# Patient Record
Sex: Male | Born: 1970 | Race: White | Hispanic: No | Smoking: Current every day smoker
Health system: Southern US, Community
[De-identification: ages and names within clinical notes are randomized; demographics above are authoritative.]

---

## 2002-07-09 ENCOUNTER — Inpatient Hospital Stay (HOSPITAL_COMMUNITY): Admission: EM | Admit: 2002-07-09 | Discharge: 2002-07-13 | Payer: Self-pay | Admitting: Psychiatry

## 2007-10-26 ENCOUNTER — Emergency Department: Payer: Self-pay | Admitting: Emergency Medicine

## 2008-07-03 ENCOUNTER — Emergency Department: Payer: Self-pay | Admitting: Emergency Medicine

## 2008-10-20 ENCOUNTER — Inpatient Hospital Stay: Payer: Self-pay | Admitting: Psychiatry

## 2009-11-27 ENCOUNTER — Inpatient Hospital Stay: Payer: Self-pay | Admitting: Surgery

## 2010-02-08 ENCOUNTER — Inpatient Hospital Stay: Payer: Self-pay | Admitting: Surgery

## 2010-11-24 IMAGING — CT CT ABD-PELV W/ CM
1 of 2 series · 15 of 32 positions shown, 19 images · non-contrast
Comparison: none

REASON FOR EXAM: (1) abd pain, wbc 17k; (2) abd pain, wbc 17k
COMMENTS:

PROCEDURE:     CT  - CT ABDOMEN / PELVIS  W  - February 08, 2010  [DATE]
RESULT:
HISTORY: Abdominal pain and elevated white blood cell count. The patient
has had a prior appendectomy.
COMPARISON STUDY:    Prior study of 11/26/2009.

[Series 2: 3mm soft tissue · axial · 0.80mm/px · z∈[-310,+173]mm · 15 of 177 slices shown, 19 images]
[im 8/177  soft-tissue]
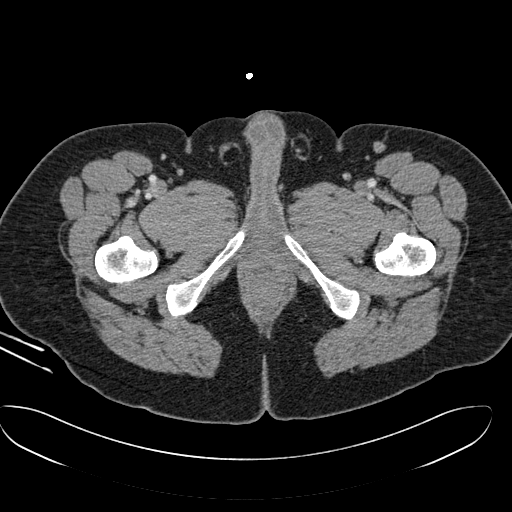
[im 8/177  bone]
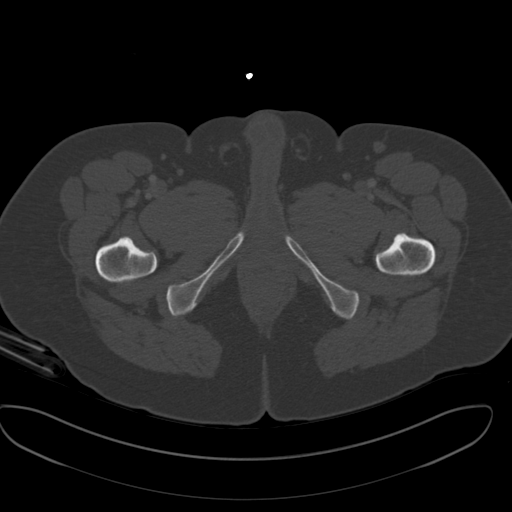
[im 22/177  soft-tissue]
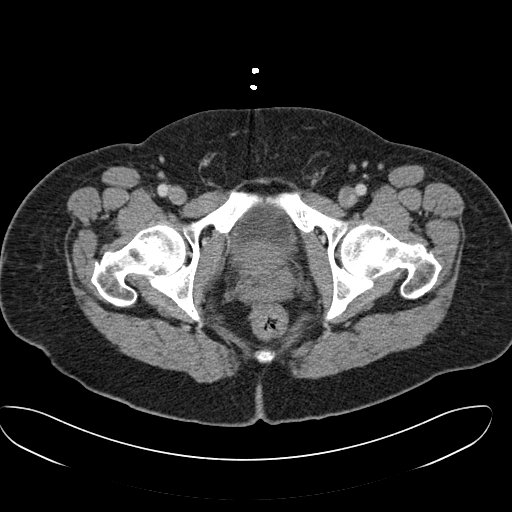
[im 36/177  soft-tissue]
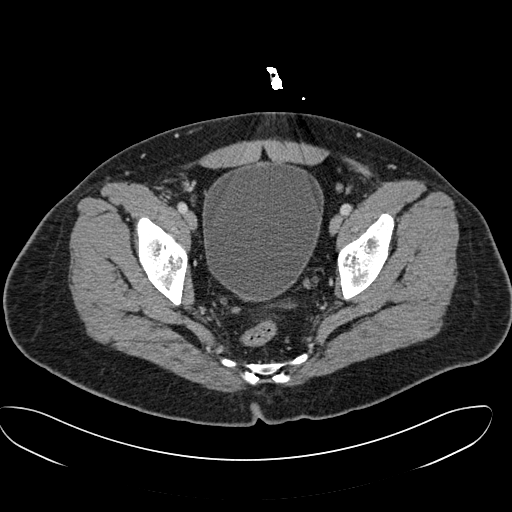
[im 50/177  soft-tissue]
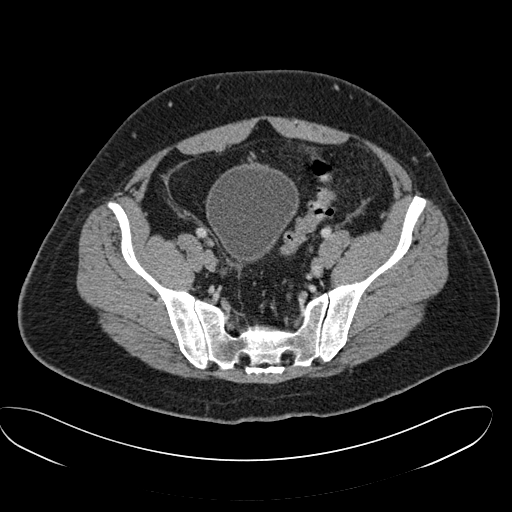
[im 64/177  soft-tissue]
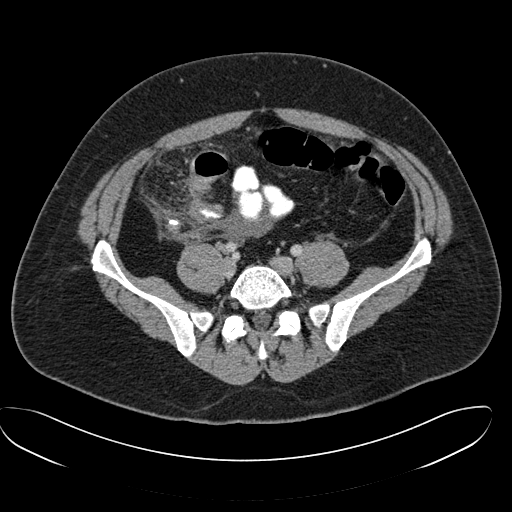
[im 78/177  soft-tissue]
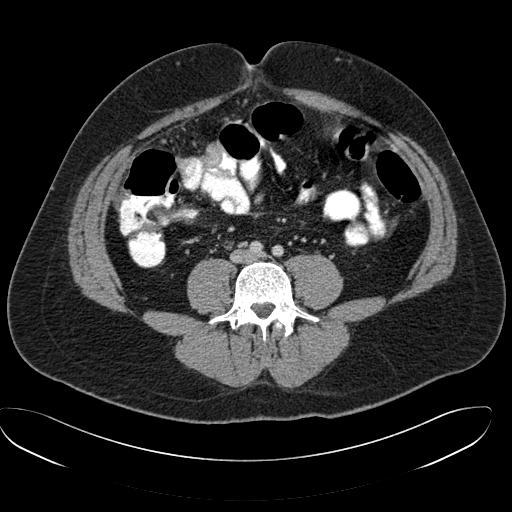
[im 92/177  soft-tissue]
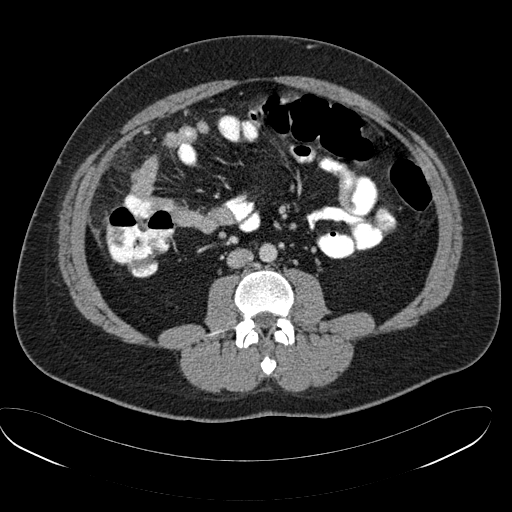
[im 99/177  soft-tissue]
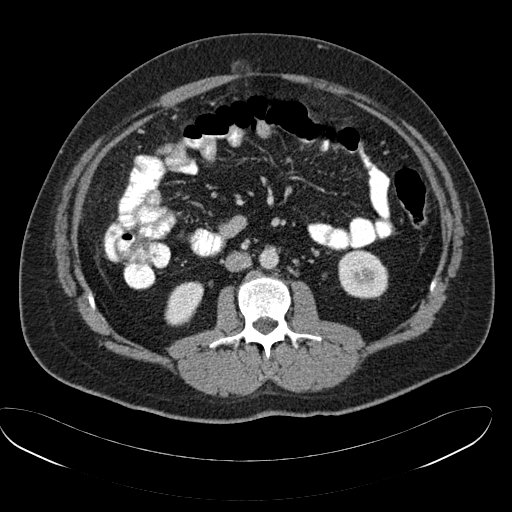
[im 113/177  soft-tissue]
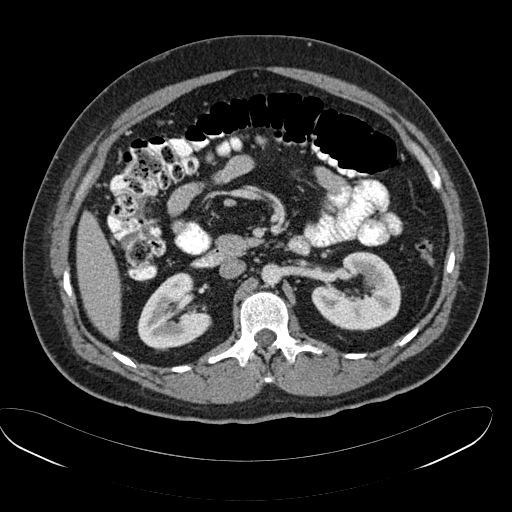
[im 113/177  bone]
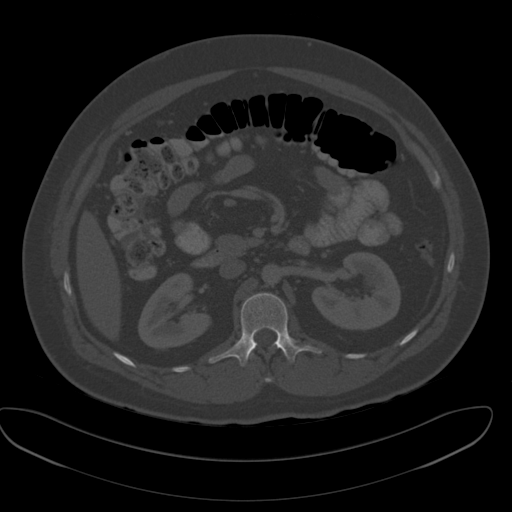
[im 127/177  soft-tissue]
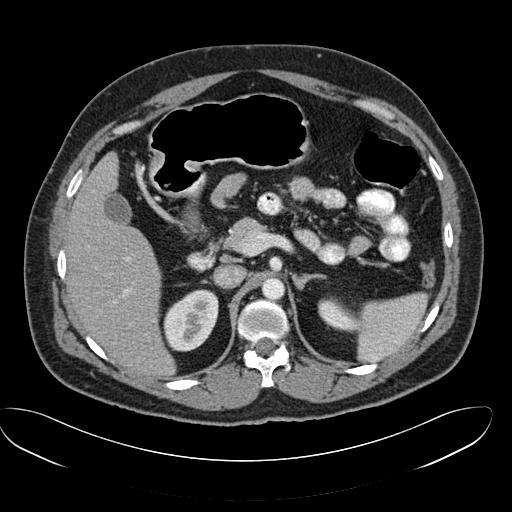
[im 141/177  soft-tissue]
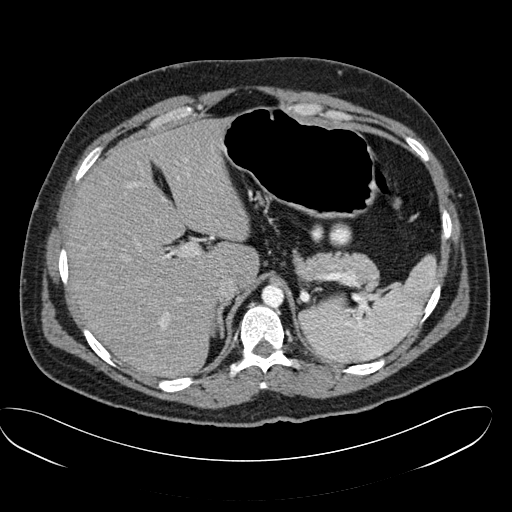
[im 148/177  lung]
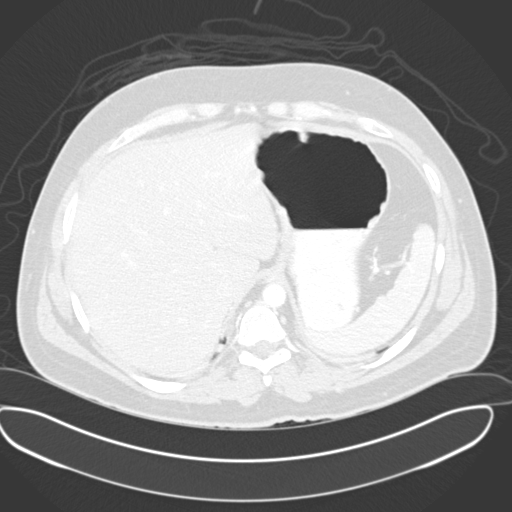
[im 155/177  soft-tissue]
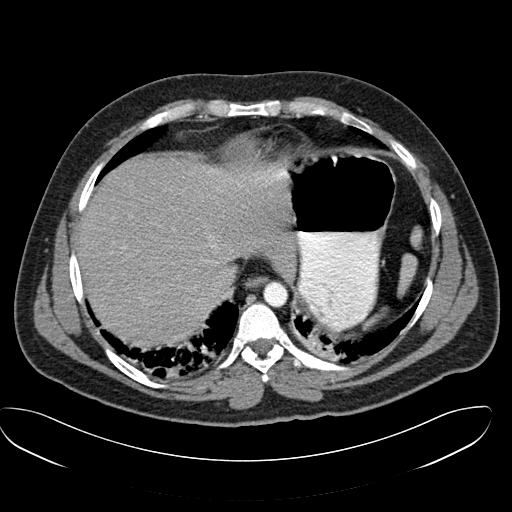
[im 155/177  lung]
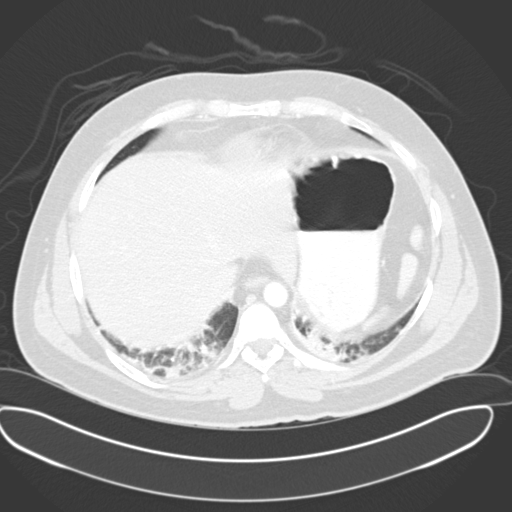
[im 162/177  lung]
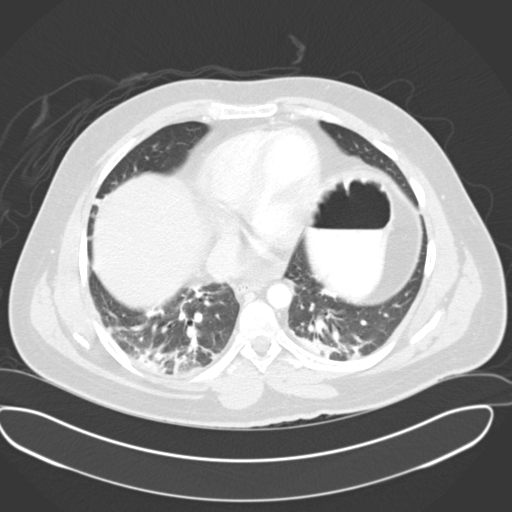
[im 169/177  soft-tissue]
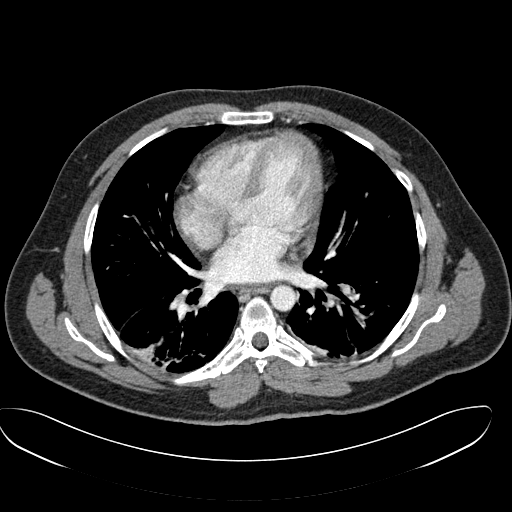
[im 169/177  lung]
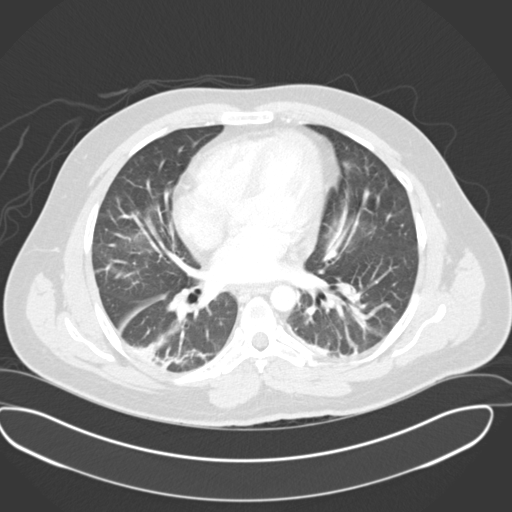

[15 of 32 positions shown; findings below may reference images not displayed]

FINDINGS: Standard IV and oral contrast enhanced CT of the abdomen and
pelvis is obtained. Bibasilar atelectasis and/or pneumonia is noted. Similar
finding was noted on a prior study. The liver is normal. Gallbladder is
nondistended. Spleen is normal. The pancreas is normal. Adrenals are normal.
Kidneys are unremarkable. No hydronephrosis. Noted in the region of the
appendiceal stump is prominent swelling of the stump with surrounding
mesenteric edema and fluid. No walled off fluid collection is noted. The
changes are consistent with inflammatory changes of the appendiceal stump.
No abscess is noted. Pelvis is unremarkable. Phleboliths are present.
Bilateral inguinal hernias are noted. Herniation of fat only is noted. No
evidence of bowel obstruction or free air.
IMPRESSION: 1. Prominent inflammatory changes noted of the appendiceal stump with
surrounding pericecal inflammatory change. No abscess.
2. Bibasilar atelectasis and/or pneumonia.

## 2010-11-26 IMAGING — CR DG ABDOMEN 2V
1 series · 5 of 5 positions shown · non-contrast
Comparison: none

REASON FOR EXAM: Ileus
COMMENTS:

PROCEDURE:     DXR - DXR ABDOMEN 2 V FLAT AND ERECT  - February 10, 2010  [DATE]
RESULT:
HISTORY: Ileus.
COMPARISON STUDIES:  Prior abdominal series of 11/28/2009.
Noted are dilated loops of small bowel. Contrast is noted in the colon from
recent CT scan. Basilar atelectasis is present.

[Series 1: view not recorded · 0.17mm/px · 5 of 5 slices shown]
[im 1/5]
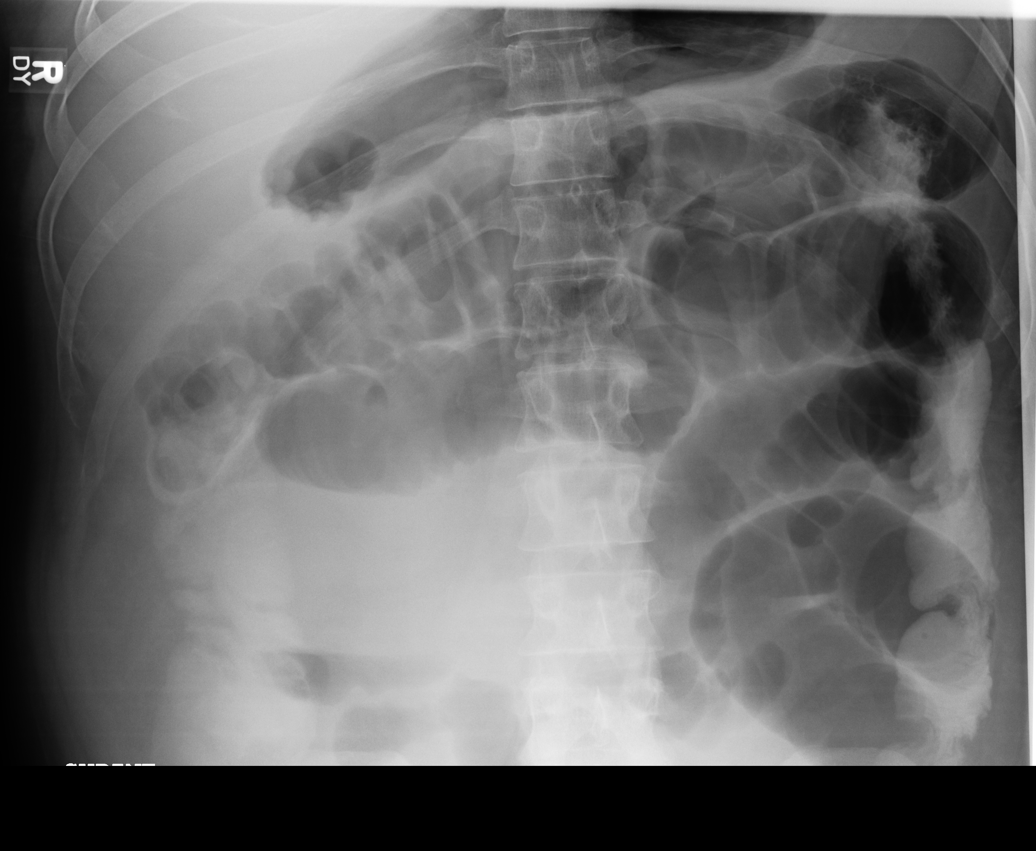
[im 2/5]
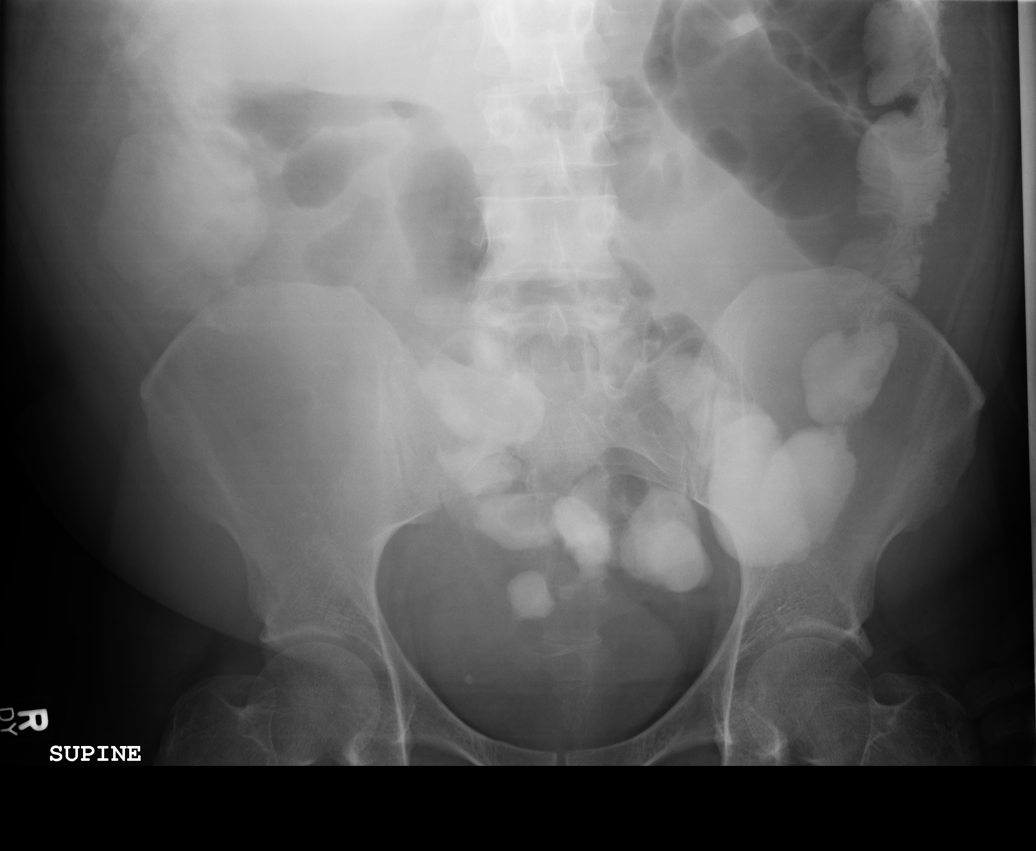
[im 3/5]
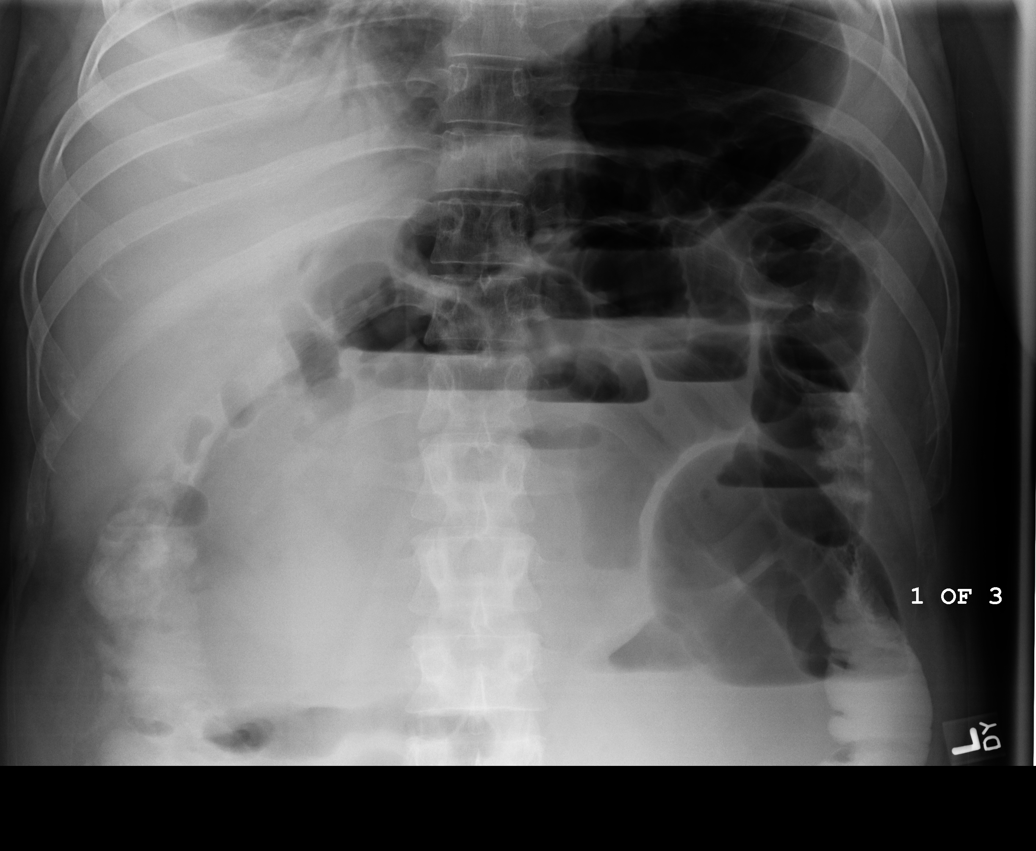
[im 4/5]
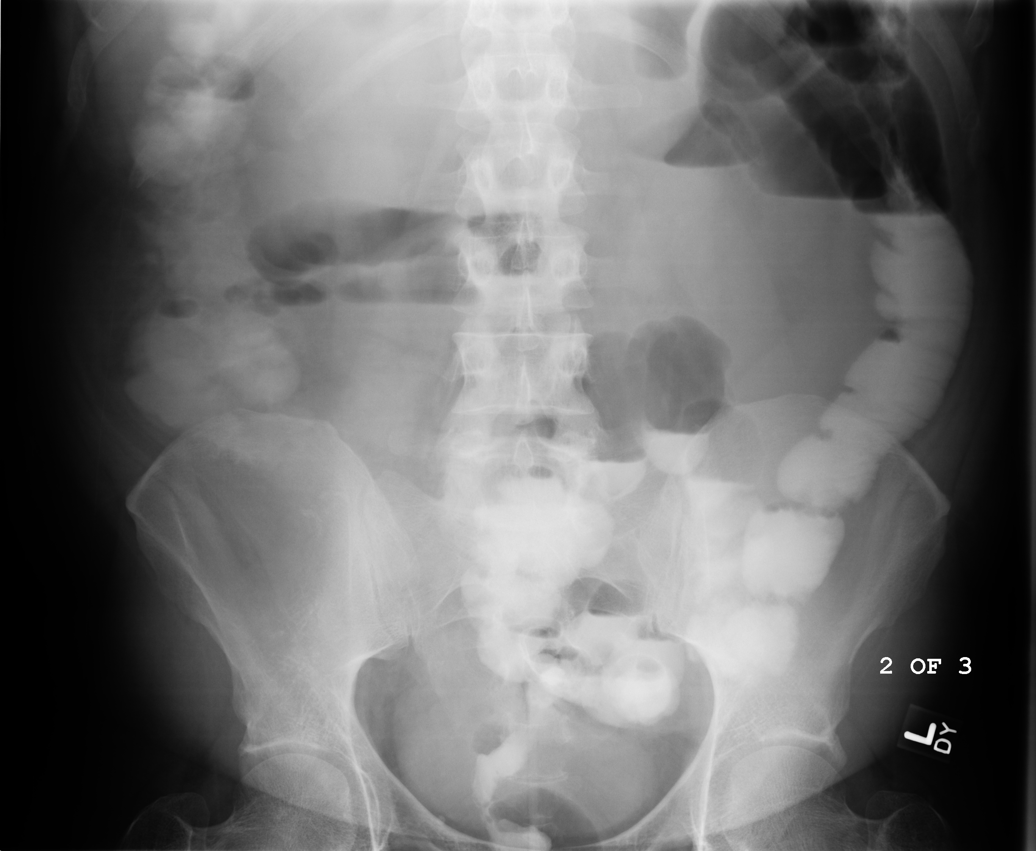
[im 5/5]
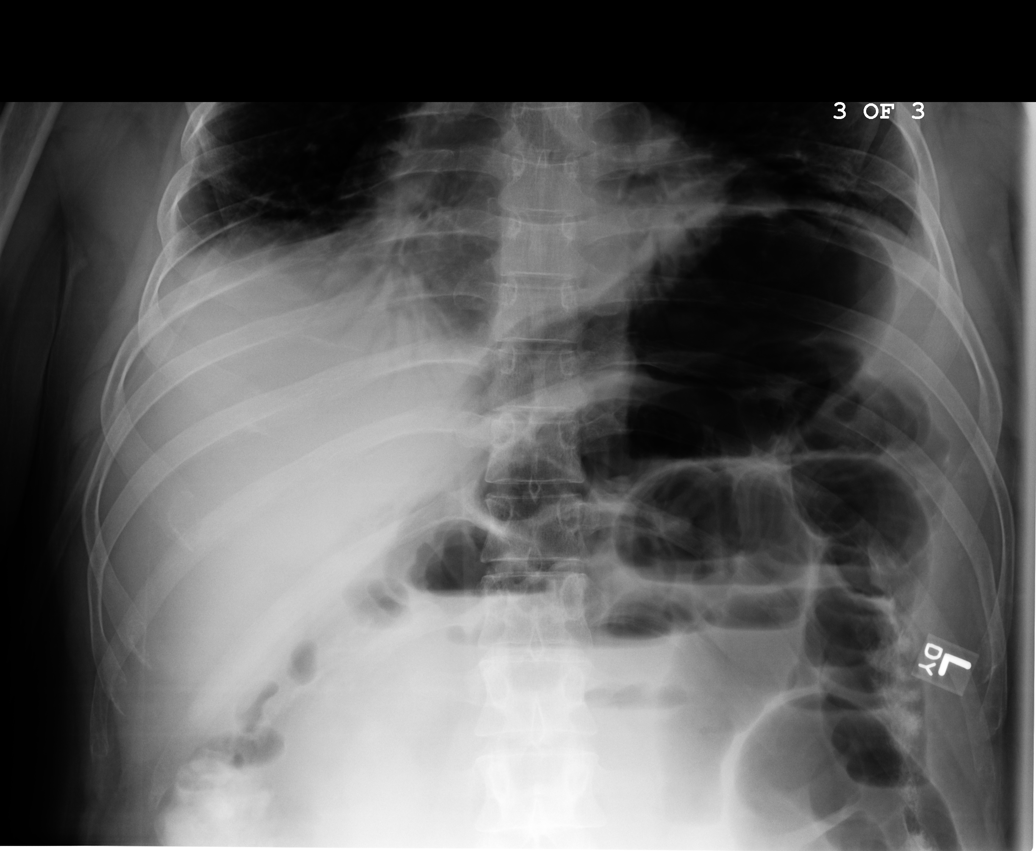

[5 of 5 positions shown; findings below may reference images not displayed]

IMPRESSION: 1.  Dilated loops of small bowel. Contrast is noted in the colon.
Differential diagnosis includes adynamic ileus and partial small bowel
obstruction.
2.  Bibasilar subsegmental atelectasis. Bibasilar pneumonia cannot be
excluded. No free air noted.

## 2020-08-17 ENCOUNTER — Other Ambulatory Visit: Payer: Self-pay

## 2020-08-17 ENCOUNTER — Emergency Department (HOSPITAL_COMMUNITY)
Admission: EM | Admit: 2020-08-17 | Discharge: 2020-08-22 | Disposition: A | Discharge status: Home / Self Care | Payer: Medicaid Other | Attending: Emergency Medicine | Admitting: Emergency Medicine

## 2020-08-17 DIAGNOSIS — U071 COVID-19: Secondary | ICD-10-CM

## 2020-08-17 DIAGNOSIS — F29 Unspecified psychosis not due to a substance or known physiological condition: Secondary | ICD-10-CM | POA: Insufficient documentation

## 2020-08-17 DIAGNOSIS — F28 Other psychotic disorder not due to a substance or known physiological condition: Secondary | ICD-10-CM | POA: Diagnosis not present

## 2020-08-17 DIAGNOSIS — Z046 Encounter for general psychiatric examination, requested by authority: Secondary | ICD-10-CM | POA: Diagnosis present

## 2020-08-17 LAB — RAPID URINE DRUG SCREEN, HOSP PERFORMED
Amphetamines: NOT DETECTED
Barbiturates: NOT DETECTED
Benzodiazepines: NOT DETECTED
Cocaine: NOT DETECTED
Opiates: NOT DETECTED
Tetrahydrocannabinol: NOT DETECTED

## 2020-08-17 LAB — COMPREHENSIVE METABOLIC PANEL
ALT: 18 U/L (ref 0–44)
AST: 20 U/L (ref 15–41)
Albumin: 3.9 g/dL (ref 3.5–5.0)
Alkaline Phosphatase: 48 U/L (ref 38–126)
Anion gap: 12 (ref 5–15)
BUN: 10 mg/dL (ref 6–20)
CO2: 25 mmol/L (ref 22–32)
Calcium: 9.2 mg/dL (ref 8.9–10.3)
Chloride: 102 mmol/L (ref 98–111)
Creatinine, Ser: 0.89 mg/dL (ref 0.61–1.24)
GFR, Estimated: >60 mL/min (ref >60)
Glucose, Bld: 105 mg/dL — ABNORMAL HIGH (ref 70–99)
Potassium: 3.7 mmol/L (ref 3.5–5.1)
Sodium: 139 mmol/L (ref 135–145)
Total Bilirubin: 0.3 mg/dL (ref 0.3–1.2)
Total Protein: 6.6 g/dL (ref 6.5–8.1)

## 2020-08-17 LAB — CBC
HCT: 45.5 % (ref 39.0–52.0)
Hemoglobin: 14.6 g/dL (ref 13.0–17.0)
MCH: 30.7 pg (ref 26.0–34.0)
MCHC: 32.1 g/dL (ref 30.0–36.0)
MCV: 95.8 fL (ref 80.0–100.0)
Platelets: 351 10*3/uL (ref 150–400)
RBC: 4.75 MIL/uL (ref 4.22–5.81)
RDW: 12.6 % (ref 11.5–15.5)
WBC: 13.5 10*3/uL — ABNORMAL HIGH (ref 4.0–10.5)
nRBC: 0 % (ref 0.0–0.2)

## 2020-08-17 LAB — ACETAMINOPHEN LEVEL: Acetaminophen (Tylenol), Serum: <10 ug/mL — ABNORMAL LOW (ref 10–30)

## 2020-08-17 LAB — ETHANOL: Alcohol, Ethyl (B): <10 mg/dL (ref <10)

## 2020-08-17 LAB — RESP PANEL BY RT-PCR (FLU A&B, COVID) ARPGX2
Influenza A by PCR: NEGATIVE
Influenza B by PCR: NEGATIVE
SARS Coronavirus 2 by RT PCR: POSITIVE — AB

## 2020-08-17 LAB — SALICYLATE LEVEL: Salicylate Lvl: <7 mg/dL — ABNORMAL LOW (ref 7.0–30.0)

## 2020-08-17 MED ORDER — ACETAMINOPHEN 325 MG PO TABS: 650.0000 mg | ORAL_TABLET | ORAL | Status: DC | PRN

## 2020-08-17 NOTE — ED Triage Notes (Signed)
Pt bib [employer] under IVC by nephew. Per papers pt has been seeing blue squirrels and snakes on the ceiling, acused his deceased mother of conspiring against him, stomping demons in the front yard and has not bathed in years. Pt is calm and cooperative in triage, denies any hallucinations, denies SI/HI

## 2020-08-17 NOTE — ED Notes (Signed)
Pt wanded °

## 2020-08-17 NOTE — BH Assessment (Addendum)
Comprehensive Clinical Assessment (CCA) Note  08/18/2020 Danny Blackwell [id number] -Clinician reviewed note by Dr. Donnald Garre.  "Patient has limited EMR history.  She denies any significant past medical history.  Patient arrives with IVC from family members.  Patient reports he has been living with his sister for about the past couple weeks and his nephew.  Patient reports he is mostly just staying in the house and watching television.  He denies drug or alcohol use.  Patient's family advised patient has had extensive hallucinations and behavioral disorder.  They advise he has been seeing squirrels and snakes on the ceiling.  He has been going out in the yard to stop demons.  He has been wandering into traffic talking to himself.  They report the patient has not bathed in approximately a year.  Incidentally, patient has tested positive for COVID.  Patient is denying that he has felt sick with fevers chills or cough.  He reports he has had a little "sinus congestion" from the weather but is well.  Patient denies suicidal or homicidal ideations."  Clinician saw patient.  He does not say much but answers "no" to almost all inquiries.  Patient was not oriented to date and time.  He had good eye contact.  Pt at a couple points appeared to be looking at something else in the room.  Pt reports normal appetite and sleep.    Patient denies any SI, HI and A/V hallucinations.  Pt denies using ETOH or other drugs.  Pt does not have any outpatient resources.  When asked if he had been inpatient anywhere he says "no."  Although he was at [facility] in 2003 he says he does not remember.  -Clinician discussed patient care with Nira Conn, FNP who recommends patient be observed overnight and have psychiatry see pt in the AM.  Chief Complaint:  Chief Complaint  Patient presents with  . IVC  . Psychiatric Evaluation   Visit Diagnosis: psychosis    CCA Screening, Triage and Referral (STR)  Patient Reported  Information How did you hear about [place]? Family/Friend (Pt stays with his sister.  Has been there 2 weeks.)  Referral name: No data recorded Referral phone number: No data recorded  Whom do you see for routine medical problems? I don't have a doctor  Practice/Facility Name: No data recorded Practice/Facility Phone Number: No data recorded Name of Contact: No data recorded Contact Number: No data recorded Contact Fax Number: No data recorded Prescriber Name: No data recorded Prescriber Address (if known): No data recorded  What Is the Reason for Your Visit/Call Today? Pt was placed on IVC by a family member.  According to paperwork, patient has been seeing blue squirrels and snakes on the ceiling.  He has been "stomping demons in the yard" and saying that his deceased mother has been conspiring against him.  How Long Has This Been Causing You Problems? > than 6 months  What Do You Feel Would Help You the Most Today? Other (Comment) (CCA due to IVC.)   Have You Recently Been in Any Inpatient Treatment (Hospital/Detox/Crisis Center/28-Day Program)? No  Name/Location of Program/Hospital:No data recorded How Long Were You There? No data recorded When Were You Discharged? No data recorded  Have You Ever Received Services From [facility] Before? Yes  Who Do You See at [facility]? Was at [facility] in 2003   Have You Recently Had Any Thoughts About Hurting Yourself? No  Are You Planning to Commit Suicide/Harm Yourself At This time?  No   Have you Recently Had Thoughts About Hurting Someone Karolee Ohs? No  Explanation: No data recorded  Have You Used Any Alcohol or Drugs in the Past 24 Hours? No  How Long Ago Did You Use Drugs or Alcohol? No data recorded What Did You Use and How Much? No data recorded  Do You Currently Have a Therapist/Psychiatrist? No  Name of Therapist/Psychiatrist: No data recorded  Have You Been Recently Discharged From Any Office Practice or Programs?  No  Explanation of Discharge From Practice/Program: No data recorded    CCA Screening Triage Referral Assessment Type of Contact: Tele-Assessment  Is this Initial or Reassessment? Initial Assessment  Date Telepsych consult ordered in [facility]:  08/17/2020  Time Telepsych consult ordered in [facility]:  No data recorded  Patient Reported Information Reviewed? Yes  Patient Left Without Being Seen? No data recorded Reason for Not Completing Assessment: No data recorded  Collateral Involvement: No data recorded  Does Patient Have a Court Appointed Legal Guardian? No data recorded Name and Contact of Legal Guardian: No data recorded If Minor and Not Living with Parent(s), Who has Custody? No data recorded Is CPS involved or ever been involved? No data recorded Is APS involved or ever been involved? Never   Patient Determined To Be At Risk for Harm To Self or Others Based on Review of Patient Reported Information or Presenting Complaint? No  Method: No data recorded Availability of Means: No data recorded Intent: No data recorded Notification Required: No data recorded Additional Information for Danger to Others Potential: No data recorded Additional Comments for Danger to Others Potential: No data recorded Are There Guns or Other Weapons in Your Home? No data recorded Types of Guns/Weapons: No data recorded Are These Weapons Safely Secured?                            No data recorded Who Could Verify You Are Able To Have These Secured: No data recorded Do You Have any Outstanding Charges, Pending Court Dates, Parole/Probation? No data recorded Contacted To Inform of Risk of Harm To Self or Others: No data recorded  Location of Assessment: [facility] ED   Does Patient Present under Involuntary Commitment? Yes  IVC Papers Initial File Date: 08/17/2020   [place] of Residence: [place]   Patient Currently Receiving the Following Services: Not Receiving Services   Determination of Need:  Emergent (2 hours)   Options For Referral: Therapeutic Triage Services     CCA Biopsychosocial Intake/Chief Complaint:  Pt was placed on IVC by a family member.  According to paperwork, patient has been seeing blue squirrels and snakes on the ceiling.  He has been "stomping demons in the yard" and saying that his deceased mother has been conspiring against him.  Pt reportedly has not bathed "in years."  Current Symptoms/Problems: Per IVC papers patient has been seeing and hearing things.  Pt is not on any medication and has no outpatient services.  Pt denies SI, HI, or A/V hallucinations. Pt has been living with his sister for the last two weeks.  Before that he had his own apartment.  Patient says that he had had his own apartment but he had to leave it because the rent went up too much.   Patient Reported Schizophrenia/Schizoaffective Diagnosis in Past: No (Unknown)   Strengths: No data recorded Preferences: No data recorded Abilities: No data recorded  Type of Services Patient Feels are Needed: No data recorded  Initial Clinical Notes/Concerns: No data recorded  Mental Health Symptoms Depression:  None (Pt denies)   Duration of Depressive symptoms: No data recorded  Mania:  None   Anxiety:   None (Unknown)   Psychosis:  Hallucinations (Per IVC papers.)   Duration of Psychotic symptoms: Greater than six months (Unsure)   Trauma:  No data recorded  Obsessions:  No data recorded  Compulsions:  No data recorded  Inattention:  No data recorded  Hyperactivity/Impulsivity:  No data recorded  Oppositional/Defiant Behaviors:  None   Emotional Irregularity:  No data recorded  Other Mood/Personality Symptoms:  No data recorded   Mental Status Exam Appearance and self-care  Stature:  Average   Weight:  No data recorded  Clothing:  No data recorded  Grooming:  No data recorded  Cosmetic use:  No data recorded  Posture/gait:  No data recorded  Motor activity:  Repetitive  (Stroking goatee)   Sensorium  Attention:  No data recorded  Concentration:  Normal   Orientation:  Person; Place   Recall/memory:  -- (Unknown)   Affect and Mood  Affect:  Anxious   Mood:  No data recorded  Relating  Eye contact:  No data recorded  Facial expression:  No data recorded  Attitude toward examiner:  Guarded   Thought and Language  Speech flow: Paucity (Answering "no" to most questions.)   Thought content:  No data recorded  Preoccupation:  No data recorded  Hallucinations:  Auditory; Visual (Per IVC paperwork)   Organization:  No data recorded  [employer] of Knowledge:  Fair   Intelligence:  Average   Abstraction:  No data recorded  Judgement:  No data recorded  Reality Testing:  No data recorded  Insight:  No data recorded  Decision Making:  Only simple   Social Functioning  Social Maturity:  No data recorded  Social Judgement:  No data recorded  Stress  Stressors:  Family conflict   Coping Ability:  No data recorded  Skill Deficits:  No data recorded  Supports:  Family     Religion:    Leisure/Recreation:    Exercise/Diet: Exercise/Diet Do You Have Any Trouble Sleeping?: No   CCA Employment/Education Employment/Work Situation: Employment / Work Situation Employment situation: On disability (Pt says he is on SSI.) Why is patient on disability: Unknown How long has patient been on disability: Unknown Has patient ever been in the [employer]?: No  Education: Education Is Patient Currently Attending School?: No   CCA Family/Childhood History Family and Relationship History: Family history Marital status: Single Does patient have children?: No  Childhood History:  Childhood History Does patient have siblings?: Yes Number of Siblings: 1 Did patient suffer any verbal/emotional/physical/sexual abuse as a child?: No Did patient suffer from severe childhood neglect?: No Has patient ever been sexually  abused/assaulted/raped as an adolescent or adult?: No Was the patient ever a victim of a crime or a disaster?: No Witnessed domestic violence?: No Has patient been affected by domestic violence as an adult?: No  Child/Adolescent Assessment:     CCA Substance Use Alcohol/Drug Use: Alcohol / Drug Use Pain Medications: None Prescriptions: None Over the Counter: None History of alcohol / drug use?: No history of alcohol / drug abuse (Pt denies and his UDS is clear.)                         ASAM's:  Six Dimensions of Multidimensional Assessment  Dimension 1:  Acute  Intoxication and/or Withdrawal Potential:      Dimension 2:  Biomedical Conditions and Complications:      Dimension 3:  Emotional, Behavioral, or Cognitive Conditions and Complications:     Dimension 4:  Readiness to Change:     Dimension 5:  Relapse, Continued use, or Continued Problem Potential:     Dimension 6:  Recovery/Living Environment:     ASAM Severity Score:    ASAM Recommended Level of Treatment:     Substance use Disorder (SUD)    Recommendations for Services/Supports/Treatments:    DSM5 Diagnoses: There are no problems to display for this patient.   Patient Centered Plan: Patient is on the following Treatment Plan(s):  Impulse Control   Referrals to Alternative Service(s): Referred to Alternative Service(s):   Place:   Date:   Time:    Referred to Alternative Service(s):   Place:   Date:   Time:    Referred to Alternative Service(s):   Place:   Date:   Time:    Referred to Alternative Service(s):   Place:   Date:   Time:     Wandra MannanHarvey, Rosan Calbert Ray, LCAS

## 2020-08-17 NOTE — ED Notes (Signed)
IVC papers faxed to [phone] Pt currently consulting with TTS

## 2020-08-18 DIAGNOSIS — F28 Other psychotic disorder not due to a substance or known physiological condition: Secondary | ICD-10-CM | POA: Diagnosis present

## 2020-08-18 DIAGNOSIS — F29 Unspecified psychosis not due to a substance or known physiological condition: Secondary | ICD-10-CM

## 2020-08-18 NOTE — ED Notes (Signed)
Breakfast Ordered 

## 2020-08-18 NOTE — ED Notes (Signed)
PT has been calm and cooperative this morning during the shift. PT has no complaints this morning denies SI or HI. Pt asking staff why he is here and when he could go home.
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
## 2020-08-18 NOTE — ED Provider Notes (Signed)
[facility] EMERGENCY DEPARTMENT Provider Note   CSN: [id number] Arrival date & time: 08/17/20  1744     History Chief Complaint  Patient presents with  . IVC  . Psychiatric Evaluation    ORAN DILLENBURG is a 50 y.o. male.  HPI Patient has limited EMR history.  She denies any significant past medical history.  Patient arrives with IVC from family members.  Patient reports he has been living with his sister for about the past couple weeks and his nephew.  Patient reports he is mostly just staying in the house and watching television.  He denies drug or alcohol use.  Patient's family advised patient has had extensive hallucinations and behavioral disorder.  They advise he has been seeing squirrels and snakes on the ceiling.  He has been going out in the yard to stop demons.  He has been wandering into traffic talking to himself.  They report the patient has not bathed in approximately a year.  Incidentally, patient has tested positive for COVID.  Patient is denying that he has felt sick with fevers chills or cough.  He reports he has had a little "sinus congestion" from the weather but is well.  Patient denies suicidal or homicidal ideations.    History reviewed. No pertinent past medical history.  There are no problems to display for this patient.   History reviewed. No pertinent surgical history.     No family history on file.     Home Medications Prior to Admission medications   Not on File    Allergies    Patient has no allergy information on record.  Review of Systems   Review of Systems 10 systems reviewed and negative except as per HPI Physical Exam Updated Vital Signs BP 135/85   Pulse (!) 107   Temp 98.3 F (36.8 C) (Oral)   Resp 20   SpO2 99%   Physical Exam Constitutional:      Comments: Nontoxic.  No respiratory distress.  Well-nourished well-developed.  HENT:     Head: Normocephalic and atraumatic.     Mouth/Throat:      Pharynx: Oropharynx is clear.  Eyes:     Extraocular Movements: Extraocular movements intact.     Conjunctiva/sclera: Conjunctivae normal.  Cardiovascular:     Rate and Rhythm: Normal rate and regular rhythm.  Pulmonary:     Effort: Pulmonary effort is normal.     Breath sounds: Normal breath sounds.  Abdominal:     General: There is no distension.     Palpations: Abdomen is soft.     Tenderness: There is no abdominal tenderness. There is no guarding.  Musculoskeletal:        General: No swelling or tenderness. Normal range of motion.     Right lower leg: No edema.     Left lower leg: No edema.  Skin:    General: Skin is warm and dry.  Neurological:     General: No focal deficit present.     Sensory: No sensory deficit.     Coordination: Coordination normal.  Psychiatric:     Comments: Patient's affect is flat.  He has poor eye contact.  He is calm and directable     ED Results / Procedures / Treatments   Labs (all labs ordered are listed, but only abnormal results are displayed) Labs Reviewed  RESP PANEL BY RT-PCR (FLU A&B, COVID) ARPGX2 - Abnormal; Notable for the following components:      Result Value  SARS Coronavirus 2 by RT PCR POSITIVE (*)    All other components within normal limits  COMPREHENSIVE METABOLIC PANEL - Abnormal; Notable for the following components:   Glucose, Bld 105 (*)    All other components within normal limits  SALICYLATE LEVEL - Abnormal; Notable for the following components:   Salicylate Lvl <7.0 (*)    All other components within normal limits  ACETAMINOPHEN LEVEL - Abnormal; Notable for the following components:   Acetaminophen (Tylenol), Serum <10 (*)    All other components within normal limits  CBC - Abnormal; Notable for the following components:   WBC 13.5 (*)    All other components within normal limits  ETHANOL  RAPID URINE DRUG SCREEN, HOSP PERFORMED    EKG None  Radiology No results found.  Procedures Procedures  (including critical care time)  Medications Ordered in ED Medications  acetaminophen (TYLENOL) tablet 650 mg (has no administration in time range)    ED Course  I have reviewed the triage vital signs and the nursing notes.  Pertinent labs & imaging results that were available during my care of the patient were reviewed by me and considered in my medical decision making (see chart for details).    MDM Rules/Calculators/A&P                          Patient presents with IVC from family members.  There is description of extensive visual hallucination.  Patient also appears to represent a threat to himself.  He has reportedly been walking into traffic and talking to himself.  He appears unlikely to be able to care for himself independently at the current time.  We will proceed with TTS consult.  We will maintain IVC until fully evaluated.  Incidentally, patient has tested positive for COVID.  He is completely asymptomatic clinically he is well without cough, shortness of breath or abnormal physical exam. Final Clinical Impression(s) / ED Diagnoses Final diagnoses:  Psychosis, unspecified psychosis type (HCC)  COVID    Rx / DC Orders ED Discharge Orders    None       Arby Barrette, MD 08/18/20 0005

## 2020-08-19 DIAGNOSIS — F28 Other psychotic disorder not due to a substance or known physiological condition: Secondary | ICD-10-CM

## 2020-08-19 DIAGNOSIS — F29 Unspecified psychosis not due to a substance or known physiological condition: Secondary | ICD-10-CM

## 2020-08-19 MED ORDER — OLANZAPINE 2.5 MG PO TABS: 2.5000 mg | ORAL_TABLET | Freq: Two times a day (BID) | ORAL | 1 refills | Status: DC

## 2020-08-19 MED ORDER — OLANZAPINE 2.5 MG PO TABS
2.5000 mg | ORAL_TABLET | Freq: Two times a day (BID) | ORAL | Status: DC
  Filled 2020-08-19 (×2): qty 1

## 2020-08-19 MED ORDER — OLANZAPINE 5 MG PO TABS
10.0000 mg | ORAL_TABLET | Freq: Two times a day (BID) | ORAL | Status: DC
  Filled 2020-08-19 (×3): qty 1
  Filled 2020-08-19: qty 2
  Filled 2020-08-19 (×2): qty 1

## 2020-08-19 NOTE — ED Notes (Signed)
Lunch Tray Ordered @ 1037. 
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
## 2020-08-19 NOTE — ED Notes (Signed)
Patient pacing in room, given ginger ale as requested. Patient has very intense eye contact, voice and affect very flat. Patient responds the same answer to all questions about his mood. He says he feels "nothing". Patient denies SI and HI. Denies hallucinations.

## 2020-08-19 NOTE — ED Notes (Signed)
Patient resting comfortably in the room on stretcher without distress noted. No safety issues identified.

## 2020-08-19 NOTE — ED Notes (Signed)
Patient is alert and ambulating in the room. Speech is clear and WNL. Patient given coke as requested. He is cleaning his room of his breakfast tray. No other requests at this time. No safety issues identified at this time. Call bell/TV remote control remains in room as patient is not SI or HI at this time.

## 2020-08-19 NOTE — ED Notes (Signed)
Contacted [employer], [place] at [phone] and informed him of patient's plan for DC home. Mr Sheets Danny Blackwell said his car was not working and Charity fundraiser offered a cab to get patient home. Then Mr Danny Blackwell reported that patient does not live there and cannot come back to that address. RN explained that the address that is on the IVC documents is the patient's address to return home legally. Mr Danny Blackwell again said no, that he would just IVC the patient again. Extensive explanations on how this is not allowed by the legal system were given to him and he continued to say he would not allow him to come there. RN conferred with CM and will refer to Adult protective Services. Patient not oriented and able to administer his own medications safely, so is not a candidate for the covid hotel resource. Patient dc plan is pending at this time on social work recommendations.

## 2020-08-19 NOTE — Consult Note (Addendum)
Patient cleared earlier, psychiatrically.  Family refuses to have him return there to live.  No history on file, except old records indicating admission at [facility] in 2010 and [facility] in 2003.  Notified tonight that he is pacing and confused and refusing medications.  Calm and cooperative earlier today, cleaned his room via notes and requested food and drink as needed. Was not responding to internal stimuli at the time and denied all issues--hallucinations, suicidal/homicidal ideations, and substance abuse.  Evidently, according to notes, he never left the house and told this provider he watches television most of the day.  Cancel discharge and attempt to move to [facility] COVID unit, placement issue potential.   Nanine Means, PMHNP

## 2020-08-19 NOTE — Consult Note (Cosign Needed Addendum)
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  Family refuses to have him return there to live.  No history on file, admitted at [facility] in 2010.  Notified tonight that he is pacing and confused.  Cancel discharge and attempt to move to [facility] COVID unit.  Patient calm and cooperative, not responding to internal stimuli.  Denies suicidal/homicidal ideations,hallucinations, and substance abuse.  Prior to admission allegedly seeing snakes and demons.  Number listed from IVC source not operational.  He denies these things and not responding on assessment.  When asked if he was paranoid about his family being against him, he stated "Why would my family be against me?"  No paranoia.  COVID positive.  Psychiatrically stable at this time.  Psychiatrically stable, no issues in the ED.  Total Time spent with patient: 45 minutes  Past Psychiatric History: Unspecified mood disorder, admitted twice to [facility] in the past at [facility] and [facility]  Past Medical History: History reviewed. No pertinent past medical history. History reviewed. No pertinent surgical history. Family History: No family history on file. Family Psychiatric  History: unknown Social History:  Social History   Substance and Sexual Activity  Alcohol Use None     Social History   Substance and Sexual Activity  Drug Use Not on file    Social History   Socioeconomic History  . Marital status: Single    Spouse name: Not on file  . Number of children: Not on file  . Years of education: Not on file  . Highest education level: Not on file  Occupational History  . Not on file  Tobacco Use  . Smoking status: Not  on file  . Smokeless tobacco: Not on file  Substance and Sexual Activity  . Alcohol use: Not on file  . Drug use: Not on file  . Sexual activity: Not on file  Other Topics Concern  . Not on file  Social History Narrative  . Not on file   Social Determinants of Health   Financial Resource Strain: Not on file  Food Insecurity: Not on file  Transportation Needs: Not on file  Physical Activity: Not on file  Stress: Not on file  Social Connections: Not on file    Has this patient used any form of tobacco in the last 30 days? (Cigarettes, Smokeless Tobacco, Cigars, and/or Pipes) A prescription for an FDA-approved tobacco cessation medication was offered at discharge and the patient refused  Current Medications: Current Facility-Administered Medications  Medication Dose Route Frequency Provider Last Rate Last Admin  . acetaminophen (TYLENOL) tablet 650 mg  650 mg Oral Q4H PRN Arby Barrette, MD      . OLANZapine (ZYPREXA) tablet 2.5 mg  2.5 mg Oral BID Charm Rings, NP       No current outpatient medications on file.   PTA Medications: (Not in a hospital admission)   Musculoskeletal: Strength & Muscle Tone: within normal limits Gait & Station: normal Patient leans: N/A  Psychiatric Specialty Exam: Physical Exam Vitals and nursing note reviewed.  Constitutional:      Appearance: Normal appearance.  HENT:     Head: Normocephalic.     Nose: Nose normal.  Pulmonary:  Effort: Pulmonary effort is normal.  Musculoskeletal:        General: Normal range of motion.     Cervical back: Normal range of motion.  Neurological:     General: No focal deficit present.     Mental Status: He is alert.  Psychiatric:        Attention and Perception: Attention and perception normal.        Mood and Affect: Mood is anxious. Affect is flat.        Speech: Speech normal.        Behavior: Behavior normal. Behavior is cooperative.        Thought Content: Thought content normal.         Cognition and Memory: Cognition and memory normal.        Judgment: Judgment normal.     Review of Systems  Psychiatric/Behavioral: The patient is nervous/anxious.   All other systems reviewed and are negative.   Blood pressure 107/79, pulse 90, temperature 97.8 F (36.6 C), temperature source Oral, resp. rate 20, SpO2 95 %.There is no height or weight on file to calculate BMI.  General Appearance: Casual  Eye Contact:  Good  Speech:  Normal Rate  Volume:  Normal  Mood:  Euthymic  Affect:  Blunt  Thought Process:  Coherent and Descriptions of Associations: Intact  Orientation:  Full (Time, Place, and Person)  Thought Content:  WDL and Logical  Suicidal Thoughts:  No  Homicidal Thoughts:  No  Memory:  Immediate;   Fair Recent;   Fair Remote;   Fair  Judgement:  Fair  Insight:  Fair  Psychomotor Activity:  Normal  Concentration:  Concentration: Fair and Attention Span: Fair  Recall:  [employer] of Knowledge:  Fair  Language:  Good  Akathisia:  No  Handed:  Right  AIMS (if indicated):     Assets:  Leisure Time Resilience Social Support  ADL's:  Intact  Cognition:  WNL  Sleep:        Demographic Factors:  Male and Caucasian  Loss Factors: NA  Historical Factors: NA  Risk Reduction Factors:   Sense of responsibility to family, Living with another person, especially a relative and Positive social support  Continued Clinical Symptoms:  None  Cognitive Features That Contribute To Risk:  None    Suicide Risk:  Minimal: No identifiable suicidal ideation.  Patients presenting with no risk factors but with morbid ruminations; may be classified as minimal risk based on the severity of the depressive symptoms   Plan Of Care/Follow-up recommendations:  Psychosis: -Started Zyprexa 2.5 mg BID Activity:  as tolerated Diet:  heart healthy diet  Disposition: discharge home to family when medically cleared Nanine Means, NP 08/19/2020, 2:13 PM

## 2020-08-19 NOTE — ED Notes (Signed)
Refused his medication

## 2020-08-19 NOTE — ED Notes (Signed)
[phone] Rinaldo Cloud called and started yelling that her brother was arrested and brought here and he can't come back there. She continued to yell he is not allowed there and doesn't pay rent and she will take a warrant out on him if he returns.Calmly explained to Mrs Sherald Barge that we are not planning on sending patient there to be arrested and that Adult Protective Services will be working with the situation in the future to resolve the issue. Patient's sister hung up after continuing to yell for several more minutes.

## 2020-08-19 NOTE — ED Notes (Signed)
Pt given breakfast.

## 2020-08-20 NOTE — ED Notes (Signed)
Lunch Tray Ordered @ 1109. °

## 2020-08-20 NOTE — ED Notes (Signed)
Breakfast Ordered 

## 2020-08-20 NOTE — ED Notes (Signed)
Pt pacing around bed in room. Asked if Pt was ok pt stared at me and kept walking around the bed

## 2020-08-20 NOTE — Care Management (Signed)
Received message from APS, due to the patient not having a documented disability they are recommending the patient seek shelter in area shelters, [facility], [facility], [facility] or [facility] are open to see if the paitent can go to one of these places.

## 2020-08-20 NOTE — ED Notes (Signed)
Writer into room to give pt Zyprexa. Pt refusing medication saying that we are all delusional and he does not need medication. States he kidnapped and brought here against his will. Pt allowed writer to obtain vitals. Pt continuously gets out of bed, paces the floor and gets back in bed.

## 2020-08-21 MED ORDER — OLANZAPINE 10 MG IM SOLR: 10.0000 mg | Freq: Once | INTRAMUSCULAR | Status: DC | PRN

## 2020-08-21 NOTE — ED Notes (Signed)
Patient states medication makes him sick-Monique,RN

## 2020-08-21 NOTE — ED Notes (Signed)
Attempted to call report at [facility]. RN states this pt does not have a bed at [facility] at this time. This RN has a note from Diplomatic Services operational officer stating pt had be on 503-1 at [facility]. Jola Babinski accepting. Phone number of RN, [phone]. At this time, pt will remain at [facility] while awaiting placement.

## 2020-08-21 NOTE — ED Notes (Signed)
Pt calmly walking around the room. Pt has done so in the past 10 minutes, when asked if pt s okay, pt will shake his head. Pt has asked for a coca-cola. Soda given to pt. Pt continued to walk around. Will continue to monitor ptl.,

## 2020-08-21 NOTE — ED Notes (Signed)
Dinner tray at bedside

## 2020-08-21 NOTE — ED Notes (Signed)
IVC rescinded 08/19/2020 Breakfast Ordered

## 2020-08-21 NOTE — ED Notes (Signed)
Pt alert, calm, watching TV; sitter at bedside

## 2020-08-22 DIAGNOSIS — F23 Brief psychotic disorder: Secondary | ICD-10-CM

## 2020-08-22 NOTE — ED Notes (Signed)
Pt given lunch

## 2020-08-22 NOTE — Consult Note (Addendum)
Telepsych Consultation   Location of Patient: [facility] Location of Provider: [facility]  Patient Identification: Danny Blackwell MRN:  [id number] Principal Diagnosis: Psychosis [facility]) Diagnosis:  Principal Problem:   Psychosis (HCC)   Total Time spent with patient: 20 minutes  HPI:  Reassessment: Patient seen via telepsych. Chart reviewed. Danny Blackwell is a 50 year old male with unknown psychiatric history who presented to [facility] under IVC from his family on 08/17/20. I was informed by EDP today that he is still in the ED. Apparently his TTS consult had been discontinued at some point, and IVC was rescinded, but the most recent psych consult note from 08/19/20 recommended inpatient treatment.  On assessment today, patient is calm and cooperative, oriented x3. Nursing reports no behavioral concerns. Patient with flat affect, apparent thought blocking at times. Speech is normal rate, logical, and well-organized. No paranoid or delusional thought content expressed. He states that he does not know why his family wanted him in the hospital. He states that he had lived in in apartment until recently which he paid for with SSI income. He was no longer able to afford the apartment after they increased his rent, and he moved in with his sister and nephew for two weeks so that he could look for a job. Per review of prior notes, his sister and nephew will not allow him to return to their home.   Prior notes indicate patient had psychiatric hospitalizations in 2003 and 2010. Those records are not available in Epic. Patient states he is unsure what he was diagnosed with at that time, but denies recent psychotropic medications and does not feel he has any mental health problems. He denies any SI/HI. He reports stable mood. Denies problems with depression, agitation, insomnia, energy or appetite changes. Denies AVH and shows no signs of responding to internal stimuli. No evidence of paranoia or delusions.  He specifically denies any hallucinations of demons, squirrels or snakes as reported in IVC paperwork. IVC paperwork also stated he believed that his deceased mother was conspiring against him. When asked today if he felt his mother was conspiring against him, patient states, "Of course not. My mother is dead." Denies any drug or alcohol use. He states that he is frustrated about being held in the hospital against his will when he does not feel that he needs to be there. He would like to discharge and go to a homeless shelter so that he can look for a job. I validated patient's frustrations and assured him I would work on getting him discharged. He is declining outpatient referrals.  Per TTS assessment: -Clinician reviewed note by Dr. Donnald Garre.  "Patient has limited EMR history. She denies any significant past medical history. Patient arrives with IVC from family members. Patient reports he has been living with his sister for about the past couple weeks and his nephew. Patient reports he is mostly just staying in the house and watching television. He denies drug or alcohol use. Patient's family advised patient has had extensive hallucinations and behavioral disorder. They advise he has been seeing squirrels and snakes on the ceiling. He has been going out in the yard to stop demons. He has been wandering into traffic talking to himself. They report the patient has not bathed in approximately a year. Incidentally, patient has tested positive for COVID. Patient is denying that he has felt sick with fevers chills or cough. He reports he has had a little "sinus congestion" from the weather but is well.Patient denies suicidal  or homicidal ideations."  Clinician saw patient.  He does not say much but answers "no" to almost all inquiries.  Patient was not oriented to date and time.  He had good eye contact.  Pt at a couple points appeared to be looking at something else in the room.  Pt reports normal  appetite and sleep.    Patient denies any SI, HI and A/V hallucinations.  Pt denies using ETOH or other drugs.  Pt does not have any outpatient resources.  When asked if he had been inpatient anywhere he says "no."  Although he was at [facility] in 2003 he says he does not remember.  Disposition: Patient shows no evidence of acute risk of harm to self or others and is psych cleared for discharge. CSW and ED staff updated. TOC consult placed for COVID hotel, as patient homeless and COVID positive.  Past Psychiatric History: See above  Risk to Self:   Risk to Others:   Prior Inpatient Therapy:   Prior Outpatient Therapy:    Past Medical History: History reviewed. No pertinent past medical history. History reviewed. No pertinent surgical history. Family History: No family history on file. Family Psychiatric  History: Unknown Social History:  Social History   Substance and Sexual Activity  Alcohol Use None     Social History   Substance and Sexual Activity  Drug Use Not on file    Social History   Socioeconomic History  . Marital status: Single    Spouse name: Not on file  . Number of children: Not on file  . Years of education: Not on file  . Highest education level: Not on file  Occupational History  . Not on file  Tobacco Use  . Smoking status: Not on file  . Smokeless tobacco: Not on file  Substance and Sexual Activity  . Alcohol use: Not on file  . Drug use: Not on file  . Sexual activity: Not on file  Other Topics Concern  . Not on file  Social History Narrative  . Not on file   Social Determinants of Health   Financial Resource Strain: Not on file  Food Insecurity: Not on file  Transportation Needs: Not on file  Physical Activity: Not on file  Stress: Not on file  Social Connections: Not on file   Additional Social History:    Allergies:  Not on File  Labs: No results found for this or any previous visit (from the past 48 hour(s)).  Medications:  Current  Facility-Administered Medications  Medication Dose Route Frequency Provider Last Rate Last Admin  . acetaminophen (TYLENOL) tablet 650 mg  650 mg Oral Q4H PRN Arby Barrette, MD      . OLANZapine (ZYPREXA) injection 10 mg  10 mg Intramuscular Once PRN Horton, Kristie M, DO      . OLANZapine (ZYPREXA) tablet 10 mg  10 mg Oral BID Charm Rings, NP       Current Outpatient Medications  Medication Sig Dispense Refill  . OLANZapine (ZYPREXA) 2.5 MG tablet Take 1 tablet (2.5 mg total) by mouth 2 (two) times daily. 60 tablet 1    Psychiatric Specialty Exam: Physical Exam  Review of Systems  Blood pressure 120/89, pulse 82, temperature 97.7 F (36.5 C), temperature source Oral, resp. rate 16, SpO2 96 %.There is no height or weight on file to calculate BMI.  General Appearance: Fairly Groomed  Eye Contact:  Good  Speech:  Normal Rate  Volume:  Normal  Mood:  Irritable  Affect:  Flat  Thought Process:  Coherent and Goal Directed  Orientation:  Full (Time, Place, and Person)  Thought Content:  Logical  Suicidal Thoughts:  No  Homicidal Thoughts:  No  Memory:  Immediate;   Fair Recent;   Fair Remote;   Fair  Judgement:  Fair  Insight:  Fair  Psychomotor Activity:  Normal  Concentration:  Concentration: Fair and Attention Span: Fair  Recall:  [employer] of Knowledge:  Fair  Language:  Fair  Akathisia:  No  Handed:  Right  AIMS (if indicated):     Assets:  Communication Skills Desire for Improvement Financial Resources/Insurance Leisure Time Resilience  ADL's:  Intact  Cognition:  WNL  Sleep:       Disposition: No evidence of imminent risk to self or others at present.   Patient does not meet criteria for psychiatric inpatient admission.  This service was provided via telemedicine using a 2-way, interactive audio and video technology with the identified patient and this Clinical research associate.  Aldean Baker, NP 08/22/2020 3:37 PM

## 2020-08-22 NOTE — Discharge Instructions (Addendum)
Please pick up prescription given to you by mental health provider.    Substance Abuse Treatment Programs  Intensive Outpatient Programs [facility]     [address]      [place], [place]                   [phone]       [facility] [address] [place] [address] [place], [place] [phone]  [facility]     (Inpatient and outpatient)     [address]           [phone]    [facility] [phone] (Suboxone and Methadone)  [address]      [place], [place] [address]      [phone]       [address] [place], [place] [phone]  [facility] (Outpatient/Inpatient, Chemical)    (insurance only) [phone]             [facility] (Groups & Residential) [place], [place] [phone]     [facility]     [address]     [place], [place]      [phone]       [facility] (for caregivers and family) [phone] [address] [place], [place] [phone]      Residential Treatment Programs [facility]      [address] [place], [place] [address]  [phone]       [facility] [address], [place], [place] [address] [phone]  [facility] [place]        [phone]       [facility] [phone]  [facility] ([facility]             [address]                                         [place], [place]                                                [phone] or [phone]                               [facility] [place] [address] [place], [address] [phone]  [facility]    [address]      [place], [place]     [phone]       [facility] [address] [place], [place] [phone]  [facility]   [address] [place], [place] [address]     [phone]      Admissions: 8am-3pm  M-F  [facility] ([facility]) [address] [place], [place] [phone]  [facility]: Residential Program [phone] Days)   [place], [place]      [phone] or [phone]     [facility]C: [facility] [place], [place] (Walk in Hours over the weekend or by referral)  [facility] [address] [place], [place], [place] [address] [phone]  Crisis Mobile: [facility]:  [phone] (for crisis response 24 hours a day) [facility] Hotline:  [phone] Outpatient Psychiatry and Counseling  [facility]: Mobile Crisis Management 24 hours:  [phone]  [facility] [employer] sliding scale fee and walk in schedule: M-F 8am-12pm/1pm-3pm [place], [place] [address] [place], [place] [address] [phone]  [facility] ([facility] [employer])- new patient walk-in appointments available Monday - Friday 8am -3pm.          [address] [place], [place] [address] [phone] or crisis line- [facility]/ Intensive Outpatient Therapy Program [place], [place] [address] [place]      [phone] [address] [place], [place] [address]                 [place]   [facility] [phone]. [place], [place] [address]   [employer] of Care          [address] Johnette Abraham  [place], [place] [address]       [phone]  [place], [place], [place] [address] [phone]  [facility]    [address] [place], [place] [address]     [place], [place]     [place]  [address]     [phone]       [facility] [place] [address]  [facility]     [address], [place], [place], [place] [address] [place]     [address]     [place], [place] [address]     [phone]       [facility] [address] [place], [place] [address] [phone] Resources for Temporary Residential Assistance/Crisis [facility]) M-F 8am-3pm   [address], [place] [address]   [phone] Services include: laundry, barbering, support groups, case management, phone  & computer access, showers, AA/NA mtgs, mental health/substance abuse nurse, job skills class, disability information, VA assistance, spiritual classes, etc.   [facility]   [address], [place]     [place] (women and children)       [place]. [place],  [address] [phone] [email or web address] for application and process Application Required  [facility]   [address]    [place] [address]     [phone]                    [place],  [address] [id number] application appt.) Application Required  [employer] (women only)    [place], [place]  [address]     [phone]      Intake starts 6pm daily Need valid ID, SSC, & Police report [employer] [address] [place], [place] [phone] Application Required  [employer] (men only)     [address]      [place], [place]     [phone]       [facility] (Pregnant  women only) [address] [place], [place] [phone]  [facility]      [address]      [place], [place] [address]     [phone]             [facility] [address] [place], [place] [phone] 90 day commitment/SA/Application process  [facility](men only)     [address]     [place], [place]     [phone]       Check-in at [facility] of [facility] [address] [place], [place] [address] [phone] Men/Women/Women and Children must be there by 7 pm  [facility] [place], [place] [phone]

## 2020-08-22 NOTE — ED Notes (Signed)
Discussion has been had with pt about Covid hotel provision, offered to have SW come talk to him about it. PT has declined offer for Covid hotel and wishes to be D/C to the street to find his own shelter.

## 2020-08-22 NOTE — ED Notes (Signed)
Dc instructions reviewed with pt. PT verbalized understanding. PT DC 

## 2020-08-22 NOTE — Care Management (Signed)
Patient apparently declined assistance with  homeless shelter resources and [employer] . Patient was discharged from the ED.

## 2020-08-22 NOTE — ED Notes (Signed)
Lunch Tray Ordered @ 1019. 

## 2020-08-22 NOTE — ED Provider Notes (Signed)
Emergency Medicine Observation Re-evaluation Note  Danny Blackwell is a 50 y.o. male, seen on rounds today.  Pt initially presented to the ED for complaints of IVC and Psychiatric Evaluation Currently, the patient is pacing in his room, states he is eager to leave.  Physical Exam  BP 120/89 (BP Location: Right Arm)   Pulse 82   Temp 97.7 F (36.5 C) (Oral)   Resp 16   SpO2 96%  Physical Exam General: In no acute distress. Cardiac: Normal rate Lungs: No acute respiratory distress, no increased work of breathing. Psych: Denies SI, HI.  Anxious.  ED Course / MDM  EKG:    I have reviewed the labs performed to date as well as medications administered while in observation.  Recent changes in the last 24 hours include none.  Plan  Current plan is for a evaluation by psychiatric provider.  According to the patient's medical records, patient was psychiatrically cleared on 1/16 around 2 PM.  He was subsequently reevaluated by psych due to apparent interaction with internal stimuli as documented by nursing staff.  On 1/16 around 7 PM psychiatric provider stated patient was no longer to be discharged and was to be admitted to the behavioral health hospital, however no IVC was placed at that time.  Patient has not been evaluated by psychiatric provider in the interim 72 hours, and no room was secured for him at [facility].  I have reached out to psychiatric provider for reconsultation and disposition determination.  Consult placed to Marciano Sequin, NP with psychiatric team.  She has reevaluated the patient and has determined that he is safe to be discharged home at this time.  Patient is COVID-positive, homeless, was provided contact information for multiple shelters.  Was offered transportation and placement in COVID hotel, however declined.  Patient is psychiatrically cleared and medically cleared.  He is ready for discharge at this time.    Sherrilee Gilles 08/22/20 1653    Blane Ohara,  MD 08/25/20 1736

## 2021-10-11 ENCOUNTER — Other Ambulatory Visit: Payer: Self-pay

## 2021-10-11 ENCOUNTER — Emergency Department (HOSPITAL_COMMUNITY): Payer: No Typology Code available for payment source

## 2021-10-11 ENCOUNTER — Emergency Department (HOSPITAL_COMMUNITY)
Admission: EM | Admit: 2021-10-11 | Discharge: 2021-10-15 | Disposition: A | Discharge status: Home / Self Care | Payer: No Typology Code available for payment source | Attending: Student | Admitting: Student

## 2021-10-11 ENCOUNTER — Encounter (HOSPITAL_COMMUNITY): Payer: Self-pay | Admitting: *Deleted

## 2021-10-11 DIAGNOSIS — R4182 Altered mental status, unspecified: Secondary | ICD-10-CM

## 2021-10-11 DIAGNOSIS — F29 Unspecified psychosis not due to a substance or known physiological condition: Secondary | ICD-10-CM | POA: Diagnosis present

## 2021-10-11 DIAGNOSIS — D72829 Elevated white blood cell count, unspecified: Secondary | ICD-10-CM | POA: Diagnosis not present

## 2021-10-11 DIAGNOSIS — Z20822 Contact with and (suspected) exposure to covid-19: Secondary | ICD-10-CM | POA: Insufficient documentation

## 2021-10-11 LAB — RAPID URINE DRUG SCREEN, HOSP PERFORMED
Amphetamines: NOT DETECTED
Barbiturates: NOT DETECTED
Benzodiazepines: NOT DETECTED
Cocaine: NOT DETECTED
Opiates: NOT DETECTED
Tetrahydrocannabinol: NOT DETECTED

## 2021-10-11 LAB — CBC WITH DIFFERENTIAL/PLATELET
Abs Immature Granulocytes: 0.05 10*3/uL (ref 0.00–0.07)
Basophils Absolute: 0.1 10*3/uL (ref 0.0–0.1)
Basophils Relative: 1 %
Eosinophils Absolute: 0.1 10*3/uL (ref 0.0–0.5)
Eosinophils Relative: 1 %
HCT: 35.6 % — ABNORMAL LOW (ref 39.0–52.0)
Hemoglobin: 10.7 g/dL — ABNORMAL LOW (ref 13.0–17.0)
Immature Granulocytes: 0 %
Lymphocytes Relative: 21 %
Lymphs Abs: 3.2 10*3/uL (ref 0.7–4.0)
MCH: 26.1 pg (ref 26.0–34.0)
MCHC: 30.1 g/dL (ref 30.0–36.0)
MCV: 86.8 fL (ref 80.0–100.0)
Monocytes Absolute: 0.9 10*3/uL (ref 0.1–1.0)
Monocytes Relative: 6 %
Neutro Abs: 10.6 10*3/uL — ABNORMAL HIGH (ref 1.7–7.7)
Neutrophils Relative %: 71 %
Platelets: 546 10*3/uL — ABNORMAL HIGH (ref 150–400)
RBC: 4.1 MIL/uL — ABNORMAL LOW (ref 4.22–5.81)
RDW: 16.2 % — ABNORMAL HIGH (ref 11.5–15.5)
WBC: 14.9 10*3/uL — ABNORMAL HIGH (ref 4.0–10.5)
nRBC: 0 % (ref 0.0–0.2)

## 2021-10-11 LAB — RESP PANEL BY RT-PCR (FLU A&B, COVID) ARPGX2
Influenza A by PCR: NEGATIVE
Influenza B by PCR: NEGATIVE
SARS Coronavirus 2 by RT PCR: NEGATIVE

## 2021-10-11 LAB — ETHANOL: Alcohol, Ethyl (B): <10 mg/dL (ref <10)

## 2021-10-11 MED ORDER — STERILE WATER FOR INJECTION IJ SOLN
INTRAMUSCULAR | Status: AC
  Administered 2021-10-11: 1.2 mL
  Filled 2021-10-11: qty 10

## 2021-10-11 MED ORDER — ZIPRASIDONE MESYLATE 20 MG IM SOLR
20.0000 mg | Freq: Once | INTRAMUSCULAR | Status: AC | PRN
  Administered 2021-10-11: 20 mg via INTRAMUSCULAR
  Filled 2021-10-11: qty 20

## 2021-10-11 MED ORDER — ZIPRASIDONE MESYLATE 20 MG IM SOLR
20.0000 mg | Freq: Once | INTRAMUSCULAR | Status: AC
  Administered 2021-10-11: 20 mg via INTRAMUSCULAR
  Filled 2021-10-11: qty 20

## 2021-10-11 MED ORDER — LORAZEPAM 2 MG/ML IJ SOLN
2.0000 mg | Freq: Once | INTRAMUSCULAR | Status: AC
  Administered 2021-10-11: 2 mg via INTRAMUSCULAR
  Filled 2021-10-11: qty 1

## 2021-10-11 MED ORDER — STERILE WATER FOR INJECTION IJ SOLN
INTRAMUSCULAR | Status: AC
  Administered 2021-10-11: 10 mL
  Filled 2021-10-11: qty 10

## 2021-10-11 NOTE — ED Triage Notes (Signed)
Patient brought in by EMS for AMS.  EMS states that the patient was found wandering around a neighborhood and the police were called.  EMS states that the patient will talk but starts to talk really fast.  When asking the patient questions the patient refuses to answer and then talks in a low soft tone.

## 2021-10-11 NOTE — ED Notes (Signed)
IVC paperwork faxed to [phone]. [phone] NOT WORKING

## 2021-10-11 NOTE — ED Notes (Addendum)
TTS is with pt. Pt not cooperating with TTS at this time. Pt under the effects of mild sedation

## 2021-10-11 NOTE — ED Provider Notes (Cosign Needed Addendum)
[facility] EMERGENCY DEPARTMENT Provider Note   CSN: [id number] Arrival date & time: 10/11/21  1245     History  No chief complaint on file.   Danny Blackwell is a 51 y.o. male.  Pt here with [employer].  They were called because pt was looking in peoples windows.  Pt does not give a name and rambles.  Pt gives multiple different names.  He reports he was born in [birth year].  Police unable to identify pt.  Deputy thinks pt has been using Meth due to his behavior   The history is provided by the police. The history is limited by the condition of the patient.      Home Medications Prior to Admission medications   Not on File      Allergies    Patient has no known allergies.    Review of Systems   Review of Systems  Unable to perform ROS: Psychiatric disorder  All other systems reviewed and are negative.  Physical Exam Updated Vital Signs BP 130/85 (BP Location: Left Arm)    Pulse (!) 115    Temp 99 F (37.2 C) (Oral)    Resp (!) 24    Ht 5\' 5"  (1.651 m)    Wt 63.5 kg    SpO2 100%    BMI 23.30 kg/m  Physical Exam Vitals and nursing note reviewed.  Constitutional:      Appearance: He is well-developed.     Comments: Unkept,   HENT:     Head: Normocephalic.     Mouth/Throat:     Mouth: Mucous membranes are moist.  Cardiovascular:     Rate and Rhythm: Normal rate.  Pulmonary:     Effort: Pulmonary effort is normal.  Abdominal:     General: There is no distension.  Musculoskeletal:        General: Normal range of motion.     Cervical back: Normal range of motion.  Skin:    General: Skin is warm.  Neurological:     General: No focal deficit present.     Mental Status: He is alert.     Comments: Walks, moves all extremities,    Psychiatric:        Mood and Affect: Mood normal.    ED Results / Procedures / Treatments   Labs (all labs ordered are listed, but only abnormal results are displayed) Labs Reviewed  CBC WITH DIFFERENTIAL/PLATELET - Abnormal;  Notable for the following components:      Result Value   WBC 14.9 (*)    RBC 4.10 (*)    Hemoglobin 10.7 (*)    HCT 35.6 (*)    RDW 16.2 (*)    Platelets 546 (*)    Neutro Abs 10.6 (*)    All other components within normal limits  RESP PANEL BY RT-PCR (FLU A&B, COVID) ARPGX2  COMPREHENSIVE METABOLIC PANEL  ETHANOL  RAPID URINE DRUG SCREEN, HOSP PERFORMED    EKG None  Radiology No results found.  Procedures Procedures    Medications Ordered in ED Medications  ziprasidone (GEODON) injection 20 mg (has no administration in time range)    ED Course/ Medical Decision Making/ A&P                           Medical Decision Making Amount and/or Complexity of Data Reviewed Independent Historian:     Details: Sheriff deputy Labs: ordered. Decision-making details documented in ED Course.  Details: Labs ordered, reviewed and interpreted,  Pt has an elevated wbc count of 14.0.  Chemistry if normal, Radiology: ordered.  Risk Prescription drug management. Parenteral controlled substances. Risk Details: Pt given geodan 20mg  IM when he arrived.  Pt required second dosage and ativan prior to Ct scan.     MDM:  Pt eating and drinking.  Pt putting multiple graham crackers in his mouth at the same time.  Pt encouraged to slow down and eat one at a time.  I am unable to get a history of a name.   Dr. evaluated pt and did IVC papers.  Labs obtained.  Ct ordered.  TTS ordered.  Pt given geodon 20 mg by Dr. Posey Rea.    Ct scan head shows no acute abnormality.   TTS consult pending          Final Clinical Impression(s) / ED Diagnoses Final diagnoses:  Altered mental status, unspecified altered mental status type  Psychosis, unspecified psychosis type [facility])    Rx / DC Orders ED Discharge Orders     None         [facility] 10/11/21 2307    2308, Elson Areas 10/12/21 0016

## 2021-10-11 NOTE — ED Notes (Signed)
CT attempted to take pt to CT. Pt refused at this time

## 2021-10-11 NOTE — ED Notes (Signed)
Patient came out of the room and standing in the hallway.  Asked the patient to go back into his room.  Patient went into his room and is pacing back and forth.

## 2021-10-11 NOTE — ED Notes (Signed)
Attempted to get an EKG on the patient.  Gave the patient food and drink.  Patient grabbed the banana and stated "if you touch the fucking banana its poison and contaminated and I won't be able to fucking eat it".  He the ripped the peel off of the banana and threw it on the bed. Attempted to explain how we were going to do the EKG and showed the patient what we were going to do. Patient became very agitated and kept yelling "treason get the fuck out of my face" and pointing his finger in both nurses in the room face.  Gathered supplies and left the room.

## 2021-10-11 NOTE — BH Assessment (Signed)
Attempted tele-assessment.  Pt has been given medication, appears somnolent, and would not respond to any questions. Assessment will be completed when Pt is responsive.   Pamalee Leyden, [facility], [facility] Triage Specialist [phone]

## 2021-10-11 NOTE — ED Notes (Signed)
Attempted to get a urine sample for the patient twice, patient refuses.

## 2021-10-11 NOTE — ED Notes (Signed)
EDP made aware of pt's BP

## 2021-10-11 NOTE — ED Notes (Signed)
Attempted to get vitals, patient is agitated and uncooperative.

## 2021-10-11 NOTE — ED Notes (Signed)
CT tech came to get the patient for CT.  Patient refused to get in the wheelchair and states that he wants his clothes and shoes and is ready to leave.  Provider made aware.

## 2021-10-11 NOTE — ED Notes (Addendum)
Pt belongings placed into bags. Includes several coats, a shirt and a pair of shoes. Pt refuses to change pants or socks. RN aware. Bags placed in lock room.

## 2021-10-12 LAB — COMPREHENSIVE METABOLIC PANEL
ALT: 16 U/L (ref 0–44)
AST: 22 U/L (ref 15–41)
Albumin: 4.4 g/dL (ref 3.5–5.0)
Alkaline Phosphatase: 50 U/L (ref 38–126)
Anion gap: 13 (ref 5–15)
BUN: 16 mg/dL (ref 6–20)
CO2: 22 mmol/L (ref 22–32)
Calcium: 9.3 mg/dL (ref 8.9–10.3)
Chloride: 104 mmol/L (ref 98–111)
Creatinine, Ser: 0.76 mg/dL (ref 0.61–1.24)
GFR, Estimated: >60 mL/min (ref >60)
Glucose, Bld: 96 mg/dL (ref 70–99)
Potassium: 3.8 mmol/L (ref 3.5–5.1)
Sodium: 139 mmol/L (ref 135–145)
Total Bilirubin: 0.7 mg/dL (ref 0.3–1.2)
Total Protein: 7.4 g/dL (ref 6.5–8.1)

## 2021-10-12 NOTE — ED Notes (Signed)
Pt being TTS at this time

## 2021-10-12 NOTE — ED Notes (Signed)
Nurse asked pt his birth given name and pt just stared into space, ignoring nurse. Nurse asked if he was in pain or needed anything. Pt verbalized " yes, my clothes, my money you guys stole, and taxi cab to go home." Nurse asked what address would the cab need to go to? Pt stated " [place]." Nurse asked what address. Pt said [place], I can just tell the driver what address when I get in cab. Nurse asked Pt about tattoos on his body and pt tried to hind them so nurse could not read them. Nurse asked pt if he needed anything and he said yes ' for you to leave me alone. " Pt paces in room back and forth.

## 2021-10-12 NOTE — ED Notes (Signed)
Pt ambulated to bathroom independently. Pt given dinner tray.

## 2021-10-12 NOTE — BH Assessment (Addendum)
Comprehensive Clinical Assessment (CCA) Note  10/12/2021 Kerry Hough [id number]  Disposition: Staffed case with Maxie Barb, NP who recommends overnight observation in the ED for further monitoring/evaluation, to attempt to gather identifying information/history and to submit APS report, per provider request.   Patient will be reassessed by psychiatry tomorrow morning, to determine the most appropriate disposition.   The patient demonstrates the following risk factors for suicide: Chronic risk factors for suicide include: N/A. Acute risk factors for suicide include:  UTA . Protective factors for this patient include:  UTA . Considering these factors, the overall suicide risk at this point appears to be UTA. Patient is. appropriate for outpatient follow up.  Patient is an unidentified older male who presented with the [facility] to [facility] after he was found in a neighborhood looking in peoples' windows.  Patient does not give a name and he has continued to refuse to give identifying information.   Patient had given multiple different names on arrival to the ED, and reports he was born in [birth year].  LEO has been unable to identify patient, and stated he thinks patient may have been using meth due to his behavior.  Upon assessment today, patient is observed pacing back and forth and refused to engage in assessment.  He appears disheveled, with long matted hair in a hair net.  At one point he stated, "I'm not talking to a crazy person."  Overall, speech is garbled and responses were indecipherable.  The only other response was to repeat the word "extremely" 8 times while glaring at the telepsych monitor.  When patient continued to refuse to give identifying information or answer further question, disconnected telepsych visit.    @1720  Clinician spoke with of [facility]PS. Per RN Fleet Contras, after some investigation, patient has been identified and does not appear to be missing from any facility.  Given  that he is homeless, APS doesn't typically get involved, per Bonita Quin.  Should ED team need further assistance from APS, contact # is [phone].           Chief Complaint: AMS  Visit Diagnosis: N/A UTA due to AMS Flowsheet Row ED from 10/11/2021 in [facility] EMERGENCY DEPARTMENT  C-SSRS RISK CATEGORY No Risk      CCA Screening, Triage and Referral (STR)  Patient Reported Information How did you hear about [facility]? Legal System  What Is the Reason for Your Visit/Call Today? Patient presented with the [facility] after he was found in a neighborhood looking in peoples' windows.  Pt does not give a name and only rambles.   Pt gives multiple different names and reports he was born in [birth year].  LEO has been unable to identify patient.  Deputy thinks patient may have been using Meth due to his behavior  How Long Has This Been Causing You Problems? -- (UTA)  What Do You Feel Would Help You the Most Today? Treatment for Depression or other mood problem   Have You Recently Had Any Thoughts About Hurting Yourself? -- (UTA due to altered mental status vs refusal to engage in assessment)  Are You Planning to Commit Suicide/Harm Yourself At This time? -- (UTA due to altered mental status vs refusal to engage in assessment)   Have you Recently Had Thoughts About Hurting Someone Else? -- (UTA due to altered mental status vs refusal to engage in assessment)  Are You Planning to Harm Someone at This Time? -- (UTA due to altered mental status vs refusal to engage in assessment)  Explanation: No data recorded  Have You Used Any Alcohol or Drugs in the Past 24 Hours? -- (UTA due to altered mental status vs refusal to engage in assessment)  How Long Ago Did You Use Drugs or Alcohol? No data recorded What Did You Use and How Much? No data recorded  Do You Currently Have a Therapist/Psychiatrist? -- (UTA due to altered mental status vs refusal to engage in assessment)  Name of Therapist/Psychiatrist: No  data recorded  Have You Been Recently Discharged From Any Office Practice or Programs? -- (UTA due to altered mental status vs refusal to engage in assessment)  Explanation of Discharge From Practice/Program: No data recorded    CCA Screening Triage Referral Assessment Type of Contact: Tele-Assessment  Telemedicine Service Delivery: Telemedicine service delivery: This service was provided via telemedicine using a 2-way, interactive audio and video technology  Is this Initial or Reassessment? Initial Assessment  Date Telepsych consult ordered in [facility]:  10/11/21  Time Telepsych consult ordered in [facility]:  1655  Location of Assessment: [facility]  Provider Location: [facility]   Collateral Involvement: N/A - Patient does not engage in assessment   Does Patient Have a Court Appointed Legal Guardian? No data recorded Name and Contact of Legal Guardian: No data recorded If Minor and Not Living with Parent(s), Who has Custody? No data recorded Is CPS involved or ever been involved? -- (UTA due to altered mental status vs refusal to engage in assessment)  Is APS involved or ever been involved? -- (UTA due to altered mental status vs refusal to engage in assessment)   Patient Determined To Be At Risk for Harm To Self or Others Based on Review of Patient Reported Information or Presenting Complaint? -- (UTA due to altered mental status vs refusal to engage in assessment)  Method: No data recorded Availability of Means: No data recorded Intent: No data recorded Notification Required: No data recorded Additional Information for Danger to Others Potential: No data recorded Additional Comments for Danger to Others Potential: No data recorded Are There Guns or Other Weapons in Your Home? No data recorded Types of Guns/Weapons: No data recorded Are These Weapons Safely Secured?                            No data recorded Who Could Verify You Are Able To Have These Secured: No data  recorded Do You Have any Outstanding Charges, Pending Court Dates, Parole/Probation? No data recorded Contacted To Inform of Risk of Harm To Self or Others: -- (UTA due to altered mental status vs refusal to engage in assessment)    Does Patient Present under Involuntary Commitment? Yes  IVC Papers Initial File Date: 10/11/21   [place] of Residence: -- (UTA due to altered mental status vs refusal to engage in assessment)   Patient Currently Receiving the Following Services: No data recorded  Determination of Need: Urgent (48 hours)   Options For Referral: [facility]; Medication Management; Outpatient Therapy; Inpatient Hospitalization     CCA Biopsychosocial Patient Reported Schizophrenia/Schizoaffective Diagnosis in Past: -- (UTA due to altered mental status vs refusal to engage in assessment)   Strengths: UTA due to altered mental status vs refusal to engage in assessment   Mental Health Symptoms Depression:   -- (UTA due to altered mental status vs refusal to engage in assessment)   Duration of Depressive symptoms:    Mania:   -- (UTA due to altered  mental status vs refusal to engage in assessment)   Anxiety:    -- (UTA due to altered mental status vs refusal to engage in assessment)   Psychosis:   -- (UTA due to altered mental status vs refusal to engage in assessment)   Duration of Psychotic symptoms:    Trauma:   -- (UTA due to altered mental status vs refusal to engage in assessment)   Obsessions:   -- (UTA due to altered mental status vs refusal to engage in assessment)   Compulsions:   -- (UTA due to altered mental status vs refusal to engage in assessment)   Inattention:   N/A   Hyperactivity/Impulsivity:   N/A   Oppositional/Defiant Behaviors:   N/A   Emotional Irregularity:   -- (UTA due to altered mental status vs refusal to engage in assessment)   Other Mood/Personality Symptoms:   UTA due to altered mental status vs refusal to  engage in assessment    Mental Status Exam Appearance and self-care  Stature:   Average   Weight:   Thin   Clothing:   Dirty; Disheveled   Grooming:   Neglected   Cosmetic use:   None   Posture/gait:   Rigid   Motor activity:   Restless   Sensorium  Attention:   Normal   Concentration:   -- (UTA due to altered mental status vs refusal to engage in assessment)   Orientation:   -- (UTA due to altered mental status vs refusal to engage in assessment)   Recall/memory:   -- (UTA due to altered mental status vs refusal to engage in assessment)   Affect and Mood  Affect:   Constricted   Mood:   Irritable   Relating  Eye contact:   Staring   Facial expression:   Tense   Attitude toward examiner:   -- (UTA due to altered mental status vs refusal to engage in assessment)   Thought and Language  Speech flow:  -- (UTA due to altered mental status vs refusal to engage in assessment)   Thought content:   -- (UTA due to altered mental status vs refusal to engage in assessment)   Preoccupation:   -- (UTA due to altered mental status vs refusal to engage in assessment)   Hallucinations:   -- (UTA due to altered mental status vs refusal to engage in assessment)   Organization:  No data recorded  [employer] of Knowledge:   -- (UTA due to altered mental status vs refusal to engage in assessment)   Intelligence:   -- (UTA due to altered mental status vs refusal to engage in assessment)   Abstraction:   -- (UTA due to altered mental status vs refusal to engage in assessment)   Judgement:   -- (UTA due to altered mental status vs refusal to engage in assessment)   Reality Testing:   -- (UTA due to altered mental status vs refusal to engage in assessment)   Insight:   -- (UTA due to altered mental status vs refusal to engage in assessment)   Decision Making:   -- (UTA due to altered mental status vs refusal to engage in assessment)    Social Functioning  Social Maturity:   -- (UTA due to altered mental status vs refusal to engage in assessment)   Social Judgement:   -- (UTA due to altered mental status vs refusal to engage in assessment)   Stress  Stressors:   -- (UTA due to  altered mental status vs refusal to engage in assessment)   Coping Ability:   -- (UTA due to altered mental status vs refusal to engage in assessment)   Skill Deficits:   Communication; Self-care; Decision making   Supports:   -- (UTA due to altered mental status vs refusal to engage in assessment)     Religion: Religion/Spirituality Are You A Religious Person?:  (UTA due to altered mental status vs refusal to engage in assessment) How Might This Affect Treatment?: UTA due to altered mental status vs refusal to engage in assessment  Leisure/Recreation: Leisure / Recreation Do You Have Hobbies?:  (UTA due to altered mental status vs refusal to engage in assessment)  Exercise/Diet: Exercise/Diet Do You Exercise?:  (UTA due to altered mental status vs refusal to engage in assessment) Have You Gained or Lost A Significant Amount of Weight in the Past Six Months?:  (UTA due to altered mental status vs refusal to engage in assessment) Do You Follow a Special Diet?:  (UTA due to altered mental status vs refusal to engage in assessment) Do You Have Any Trouble Sleeping?:  (UTA due to altered mental status vs refusal to engage in assessment)   CCA Employment/Education Employment/Work Situation: Employment / Work Situation Employment Situation:  (UTA due to altered mental status vs refusal to engage in assessment) Patient's Job has Been Impacted by Current Illness:  (UTA due to altered mental status vs refusal to engage in assessment)  Education: Education Is Patient Currently Attending School?: No Last Grade Completed:  (UTA due to altered mental status vs refusal to engage in assessment) Did You Attend College?:  (UTA due to altered  mental status vs refusal to engage in assessment) Did You Have An Individualized Education Program (IIEP):  (UTA) Did You Have Any Difficulty At School?:  (UTA) Patient's Education Has Been Impacted by Current Illness:  (UTA)   CCA Family/Childhood History Family and Relationship History: Family history Marital status:  (UTA due to altered mental status vs refusal to engage in assessment) Does patient have children?:  (UTA due to altered mental status vs refusal to engage in assessment)  Childhood History:  Childhood History By whom was/is the patient raised?:  (UTA) Did patient suffer any verbal/emotional/physical/sexual abuse as a child?:  (UTA) Did patient suffer from severe childhood neglect?:  (UTA) Has patient ever been sexually abused/assaulted/raped as an adolescent or adult?:  (UTA) Was the patient ever a victim of a crime or a disaster?:  (UTA) Witnessed domestic violence?:  (UTA) 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)  Child/Adolescent Assessment:     CCA Substance Use Alcohol/Drug Use: Alcohol / Drug Use Pain Medications: UTA due to altered mental status vs refusal to engage in assessment Prescriptions: UTA due to altered mental status vs refusal to engage in assessment Over the Counter: UTA due to altered mental status vs refusal to engage in assessment History of alcohol / drug use?:  (UTA due to altered mental status vs refusal to engage in assessment)      ASAM's:  Six Dimensions of Multidimensional Assessment  Dimension 1:  Acute Intoxication and/or Withdrawal Potential:      Dimension 2:  Biomedical Conditions and Complications:      Dimension 3:  Emotional, Behavioral, or Cognitive Conditions and Complications:     Dimension 4:  Readiness to Change:     Dimension 5:  Relapse, Continued use, or Continued Problem Potential:     Dimension 6:  Recovery/Living  Environment:     ASAM Severity Score:    ASAM Recommended Level of  Treatment:     Substance use Disorder (SUD)    Recommendations for Services/Supports/Treatments:    Discharge Disposition:    DSM5 Diagnoses: There are no problems to display for this patient.    Referrals to Alternative Service(s):  Yetta Glassman, [facility]

## 2021-10-12 NOTE — ED Notes (Addendum)
Nurse spoke with pt and pt stated his school records name was Blain Hunsucker. That he grew up in [place]. That when he went to school kids picked on him and he would tell his grandmother. Pt also verbalized First and last names were an abomination to GOD, and only terrorists  use those names. I am worried about making money, food, I need a job. Nurse asked what kind of work did you do. Pt said, electrical work, Holiday representative work, Pension scheme manager work, yard work , all work. Nurse asked if pt was on SSI. Pt said " I did but I don't anymore they say crazy people and I don't care about that. My mind is worried about a job and my wife. Nurse asks if he wants her to contact his wife. Pt states , " No'. Nurse asks about contacting his mother. Pt says ' Boneta Lucks?, You might as well say she is dead". My sister is my family and she lives too far away. I don't know her number. Nurse stated if you give me her name I can find her number. Pt stated" That is illegal". Pt was offered and accepted 5 drinks and several graham crackers with peanut butter to eat while talking. Pt continued to pace during the whole conversation. Pt has paced the entire shift without sitting down. 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 BH aware of pt name confirmation,

## 2021-10-12 NOTE — ED Notes (Signed)
Nurse attempting to go through patients belongings to try and find ID or identity information. Nurse found several gift cards with money on the cards. One envelope from [facility] where a gift card was given. Nothing in pt wallet. Only a lighter noticed in pt jacket. Clothes are extremely dirty.  A tattoo of a eagle  or possibly a wild cat and  " lucky" written under it on left wrist/forearm area. Tattoo on left hand each finger with a letter to spell . First finger is either an" F" or" B", middle finger "E" , ring finger " A", Pinky finger " R".

## 2021-10-12 NOTE — ED Provider Notes (Signed)
Emergency Medicine Observation Re-evaluation Note  Danny Blackwell is a 51 y.o. male, seen on rounds today.  Pt initially presented to the ED for complaints of  odd behavior and behavioral health evaluation.   Physical Exam  BP 120/78 (BP Location: Left Arm)    Pulse 88    Temp 99.1 F (37.3 C) (Oral)    Resp 12    Ht 1.651 m (5\' 5" )    Wt 63.5 kg    SpO2 100%    BMI 23.30 kg/m  Physical Exam General: content appearing, no distress.  Cardiac: regular rate.  Lungs: breathing comfortably. Psych: calm, content. Pt does not currently appears to be responding to internal stimuli.   ED Course / MDM    I have reviewed the labs performed to date as well as medications administered while in observation.  Recent changes in the last 24 hours include ED observation and reassessment.   Plan   Danny Blackwell is under involuntary commitment.   On chart review, it appears BH evaluation pending for ~ 20 hours - will re-consult TTS/BH team to assess.   The patient has been placed in psychiatric observation due to the need to provide a safe environment for the patient while obtaining psychiatric consultation and evaluation, as well as ongoing medical and medication management to treat the patient's condition.  The patient has been placed under full IVC at this time.    Lajean Saver, MD 10/12/21 [phone]

## 2021-10-12 NOTE — BH Assessment (Signed)
Clinician spoke with Apolonio Schneiders of [facility]. Per RN Vaughan Basta, after some investigation, patient has been identified and does not appear to be missing from any facility.  Given that he is homeless, APS doesn't typically get involved, per Apolonio Schneiders.  Should ED team need further assistance from APS, contact # is [phone].

## 2021-10-12 NOTE — ED Notes (Signed)
Pt ambulated to the bathroom unassisted with sitter °

## 2021-10-12 NOTE — ED Notes (Signed)
Pt is currently pacing in his room

## 2021-10-12 NOTE — ED Notes (Signed)
Pt ambulated to the bathroom under the guidance of security and sitter

## 2021-10-12 NOTE — ED Notes (Signed)
Pt walked to restroom

## 2021-10-12 NOTE — ED Notes (Signed)
Pt given breakfast tray.

## 2021-10-13 DIAGNOSIS — F29 Unspecified psychosis not due to a substance or known physiological condition: Secondary | ICD-10-CM | POA: Diagnosis present

## 2021-10-13 MED ORDER — ZIPRASIDONE MESYLATE 20 MG IM SOLR: 20.0000 mg | INTRAMUSCULAR | Status: DC | PRN

## 2021-10-13 MED ORDER — OLANZAPINE 5 MG PO TBDP
10.0000 mg | ORAL_TABLET | Freq: Every day | ORAL | Status: DC
  Administered 2021-10-13: 10 mg via ORAL
  Filled 2021-10-13 (×4): qty 2

## 2021-10-13 MED ORDER — GABAPENTIN 300 MG PO CAPS
300.0000 mg | ORAL_CAPSULE | Freq: Three times a day (TID) | ORAL | Status: DC
  Administered 2021-10-13 (×2): 300 mg via ORAL
  Filled 2021-10-13 (×5): qty 1

## 2021-10-13 MED ORDER — OLANZAPINE 5 MG PO TBDP
5.0000 mg | ORAL_TABLET | Freq: Three times a day (TID) | ORAL | Status: DC | PRN
  Administered 2021-10-14: 5 mg via ORAL
  Filled 2021-10-13 (×2): qty 1

## 2021-10-13 MED ORDER — LORAZEPAM 1 MG PO TABS: 1.0000 mg | ORAL_TABLET | ORAL | Status: DC | PRN

## 2021-10-13 NOTE — ED Notes (Signed)
Pt is now pacing the room after he woke straight up went to the restroom and then back to his room.

## 2021-10-13 NOTE — ED Notes (Signed)
Extra food given to pt per pt request. Pt took po meds without issues. Pt only asked what they were used for.

## 2021-10-13 NOTE — ED Notes (Signed)
TTS at this time.

## 2021-10-13 NOTE — ED Notes (Signed)
New IVC paperwork faxed to magistrate.

## 2021-10-13 NOTE — ED Notes (Signed)
Tray given to pt

## 2021-10-13 NOTE — ED Notes (Signed)
Pt was given dinner tray

## 2021-10-13 NOTE — ED Notes (Signed)
Attempted to give HS medications.  Pt refused, talking to himself and pacing in room.  Continues to do so with no staff in room

## 2021-10-13 NOTE — Consult Note (Addendum)
Telepsych Consultation   Reason for Consult:  psychosis Referring Physician:  Cheron SchaumannLeslie Sofia PA-C Location of [facility]:  [facility] Location of Provider: Behavioral Health TTS Department  Patient Identification: Danny LaundryJoshua M Guarnieri MRN:  [id number] Principal Diagnosis: Psychosis [facility](HCC) Diagnosis:  Principal Problem:   Psychosis (HCC)   Total Time spent with patient: 20 minutes  Subjective:   Danny LaundryJoshua M Blackwell is a 51 y.o. male patient admitted with psychosis.   On assessment patient presents laying on stretcher talking to himself and his sandwich. He does not respond to his name or answer questions. He only stopped to say, "My brains kind of blocked up. I have to go to the bathroom real bad", then proceeded to talk to himself and his sandwich. No eye contact. Unable to answer safety questions. Current recommendation is for inpatient hospitalization for further stabilization. Medications started.   HPI:  Danny LaundryJoshua M Mitter is a 51 year old male patient who initially presented to [facility] via EMS as Danny Blackwell after being found wandering around the neighborhood, mental status altered. Upon arrival to ED patient was noted to be agitated and rambling stating he was born in [birth year], and  was unable to state name, location, date, etc. Head CT negative; UDS-, BAL<10. Patient remains in ED where he has been noted to continuously pace the room despite redirection with minimal sleep.   Past Psychiatric History: psychosis  Risk to Self:  yes Risk to Others:  yes Prior Inpatient Therapy:  yes Prior Outpatient Therapy:  yes  Past Medical History: History reviewed. No pertinent past medical history. History reviewed. No pertinent surgical history. Family History: No family history on file. Family Psychiatric  History: not noted Social History:  Social History   Substance and Sexual Activity  Alcohol Use None     Social History   Substance and Sexual Activity  Drug Use Not on file    Social  History   Socioeconomic History   Marital status: Unknown    Spouse name: Not on file   Number of children: Not on file   Years of education: Not on file   Highest education level: Not on file  Occupational History   Not on file  Tobacco Use   Smoking status: Unknown   Smokeless tobacco: Not on file  Vaping Use   Vaping Use: Unknown  Substance and Sexual Activity   Alcohol use: Not on file   Drug use: Not on file   Sexual activity: Not on file  Other Topics Concern   Not on file  Social History Narrative   Not on file   Social Determinants of Health   Financial Resource Strain: Not on file  Food Insecurity: Not on file  Transportation Needs: Not on file  Physical Activity: Not on file  Stress: Not on file  Social Connections: Not on file   Additional Social History:    Allergies:  No Known Allergies  Labs:  Results for orders placed or performed during the hospital encounter of 10/11/21 (from the past 48 hour(s))  Comprehensive metabolic panel     Status: None   Collection Time: 10/11/21  1:44 PM  Result Value Ref Range   Sodium 139 135 - 145 mmol/L   Potassium 3.8 3.5 - 5.1 mmol/L   Chloride 104 98 - 111 mmol/L   CO2 22 22 - 32 mmol/L   Glucose, Bld 96 70 - 99 mg/dL    Comment: Glucose reference range applies only to samples taken after fasting  for at least 8 hours.   BUN 16 6 - 20 mg/dL    Comment: QA FLAGS AND/OR RANGES MODIFIED BY DEMOGRAPHIC UPDATE ON 03/11 AT 1724   Creatinine, Ser 0.76 0.61 - 1.24 mg/dL   Calcium 9.3 8.9 - 16.1 mg/dL   Total Protein 7.4 6.5 - 8.1 g/dL   Albumin 4.4 3.5 - 5.0 g/dL   AST 22 15 - 41 U/L   ALT 16 0 - 44 U/L   Alkaline Phosphatase 50 38 - 126 U/L   Total Bilirubin 0.7 0.3 - 1.2 mg/dL   GFR, Estimated >09 >60 mL/min    Comment: (NOTE) Calculated using the CKD-EPI Creatinine Equation (2021)    Anion gap 13 5 - 15    Comment: Performed at [facility], [address] [place], [place] [address]  Ethanol     Status: None    Collection Time: 10/11/21  1:44 PM  Result Value Ref Range   Alcohol, Ethyl (B) <10 <10 mg/dL    Comment: (NOTE) Lowest detectable limit for serum alcohol is 10 mg/dL.  For medical purposes only. Performed at [facility], [address] [place], [place] [address]   CBC with Diff     Status: Abnormal   Collection Time: 10/11/21  1:44 PM  Result Value Ref Range   WBC 14.9 (H) 4.0 - 10.5 K/uL   RBC 4.10 (L) 4.22 - 5.81 MIL/uL   Hemoglobin 10.7 (L) 13.0 - 17.0 g/dL   HCT 47.8 (L) 29.5 - 62.1 %   MCV 86.8 80.0 - 100.0 fL   MCH 26.1 26.0 - 34.0 pg   MCHC 30.1 30.0 - 36.0 g/dL   RDW 30.8 (H) 65.7 - 84.6 %   Platelets 546 (H) 150 - 400 K/uL   nRBC 0.0 0.0 - 0.2 %   Neutrophils Relative % 71 %   Neutro Abs 10.6 (H) 1.7 - 7.7 K/uL   Lymphocytes Relative 21 %   Lymphs Abs 3.2 0.7 - 4.0 K/uL   Monocytes Relative 6 %   Monocytes Absolute 0.9 0.1 - 1.0 K/uL   Eosinophils Relative 1 %   Eosinophils Absolute 0.1 0.0 - 0.5 K/uL   Basophils Relative 1 %   Basophils Absolute 0.1 0.0 - 0.1 K/uL   Immature Granulocytes 0 %   Abs Immature Granulocytes 0.05 0.00 - 0.07 K/uL    Comment: Performed at [facility], [address], [place], [place] [address]  Resp Panel by RT-PCR (Flu A&B, Covid) Nasopharyngeal Swab     Status: None   Collection Time: 10/11/21  8:43 PM   Specimen: Nasopharyngeal Swab; Nasopharyngeal(NP) swabs in vial transport medium  Result Value Ref Range   SARS Coronavirus 2 by RT PCR NEGATIVE NEGATIVE    Comment: (NOTE) SARS-CoV-2 target nucleic acids are NOT DETECTED.  The SARS-CoV-2 RNA is generally detectable in upper respiratory specimens during the acute phase of infection. The lowest concentration of SARS-CoV-2 viral copies this assay can detect is 138 copies/mL. A negative result does not preclude SARS-Cov-2 infection and should not be used as the sole basis for treatment or other patient management decisions. A negative result may occur with  improper specimen  collection/handling, submission of specimen other than nasopharyngeal swab, presence of viral mutation(s) within the areas targeted by this assay, and inadequate number of viral copies(<138 copies/mL). A negative result must be combined with clinical observations, patient history, and epidemiological information. The expected result is Negative.  Fact Sheet for Patients:  BloggerCourse.com  Fact Sheet  for Healthcare Providers:  SeriousBroker.it  This test is no t yet approved or cleared by the [place] and  has been authorized for detection and/or diagnosis of SARS-CoV-2 by FDA under an Emergency Use Authorization (EUA). This EUA will remain  in effect (meaning this test can be used) for the duration of the COVID-19 declaration under Section 564(b)(1) of the Act, 21 U.S.C.section 360bbb-3(b)(1), unless the authorization is terminated  or revoked sooner.       Influenza A by PCR NEGATIVE NEGATIVE   Influenza B by PCR NEGATIVE NEGATIVE    Comment: (NOTE) The Xpert Xpress SARS-CoV-2/FLU/RSV plus assay is intended as an aid in the diagnosis of influenza from Nasopharyngeal swab specimens and should not be used as a sole basis for treatment. Nasal washings and aspirates are unacceptable for Xpert Xpress SARS-CoV-2/FLU/RSV testing.  Fact Sheet for Patients: BloggerCourse.com  Fact Sheet for Healthcare Providers: SeriousBroker.it  This test is not yet approved or cleared by the [place] FDA and has been authorized for detection and/or diagnosis of SARS-CoV-2 by FDA under an Emergency Use Authorization (EUA). This EUA will remain in effect (meaning this test can be used) for the duration of the COVID-19 declaration under Section 564(b)(1) of the Act, 21 U.S.C. section 360bbb-3(b)(1), unless the authorization is terminated or revoked.  Performed at [facility],  [address] [place], [place] [address]   Urine rapid drug screen (hosp performed)     Status: None   Collection Time: 10/11/21  8:43 PM  Result Value Ref Range   Opiates NONE DETECTED NONE DETECTED   Cocaine NONE DETECTED NONE DETECTED   Benzodiazepines NONE DETECTED NONE DETECTED   Amphetamines NONE DETECTED NONE DETECTED   Tetrahydrocannabinol NONE DETECTED NONE DETECTED   Barbiturates NONE DETECTED NONE DETECTED    Comment: (NOTE) DRUG SCREEN FOR MEDICAL PURPOSES ONLY.  IF CONFIRMATION IS NEEDED FOR ANY PURPOSE, NOTIFY LAB WITHIN 5 DAYS.  LOWEST DETECTABLE LIMITS FOR URINE DRUG SCREEN Drug Class                     Cutoff (ng/mL) Amphetamine and metabolites    1000 Barbiturate and metabolites    200 Benzodiazepine                 200 Tricyclics and metabolites     300 Opiates and metabolites        300 Cocaine and metabolites        300 THC                            50 Performed at [facility], [address] [place], [place] [address]     Medications:  Current Facility-Administered Medications  Medication Dose Route Frequency Provider Last Rate Last Admin   gabapentin (NEURONTIN) capsule 300 mg  300 mg Oral TID Leevy-Johnson, Arcangel Minion A, NP   300 mg at 10/13/21 1022   OLANZapine zydis (ZYPREXA) disintegrating tablet 5 mg  5 mg Oral Q8H PRN Leevy-Johnson, Kenshawn Maciolek A, NP       And   LORazepam (ATIVAN) tablet 1 mg  1 mg Oral PRN Leevy-Johnson, Kris Burd A, NP       And   ziprasidone (GEODON) injection 20 mg  20 mg Intramuscular PRN Leevy-Johnson, Makayle Krahn A, NP       OLANZapine zydis (ZYPREXA) disintegrating tablet 10 mg  10 mg Oral Daily Leevy-Johnson, Lyriq Jarchow A, NP   10 mg at 10/13/21  1022   No current outpatient medications on file.    Musculoskeletal: Strength & Muscle Tone:  unable to assess Gait & Station:  unable to assess Patient leans: N/A  Psychiatric Specialty Exam:  Presentation  General Appearance: Bizarre; Disheveled Eye Contact:Minimal Speech:Pressured;  Garbled Speech Volume:Decreased Handedness:Left  Mood and Affect  Mood:Anxious Affect:Inappropriate  Thought Process  Thought Processes:Disorganized; Irrevelant Descriptions of Associations:Loose  Orientation:None  Thought Content:Scattered; Illogical  History of Schizophrenia/Schizoaffective disorder:No  Duration of Psychotic Symptoms:No data recorded Hallucinations:Hallucinations: Auditory; Tactile; Visual Description of Auditory Hallucinations: patient responding to stimuli Description of Visual Hallucinations: patient responding to stimuli  Ideas of Reference:Other (comment) (unable to fully assess)  Suicidal Thoughts:Suicidal Thoughts: No (unable to fully assess)  Homicidal Thoughts:Homicidal Thoughts: No (unable to fully assess)   Sensorium  Memory:Remote Poor; Recent Poor; Immediate Poor Judgment:Impaired Insight:None  Executive Functions  Concentration:Poor Attention Span:Poor Recall:Poor Fund of Knowledge:Poor Language:Poor  Psychomotor Activity  Psychomotor Activity:Psychomotor Activity: Increased; Restlessness  Assets  Assets:Resilience  Sleep  Sleep:Sleep: Poor   Physical Exam: Physical Exam Vitals and nursing note reviewed.  HENT:     Head: Normocephalic.     Nose: Nose normal.     Mouth/Throat:     Mouth: Mucous membranes are dry.  Eyes:     Pupils: Pupils are equal, round, and reactive to light.  Cardiovascular:     Rate and Rhythm: Normal rate.     Pulses: Normal pulses.  Pulmonary:     Effort: Pulmonary effort is normal.  Abdominal:     General: Abdomen is flat.  Musculoskeletal:        General: Normal range of motion.     Cervical back: Normal range of motion.  Skin:    General: Skin is dry.  Neurological:     Mental Status: He is alert. He is disoriented.  Psychiatric:        Attention and Perception: He is inattentive.        Mood and Affect: Mood is anxious. Affect is inappropriate.        Speech: He is  noncommunicative. Speech is rapid and pressured and delayed.        Behavior: Behavior is uncooperative.        Thought Content: Thought content is delusional. Thought content does not include homicidal or suicidal ideation. Thought content does not include homicidal or suicidal plan.        Cognition and Memory: Cognition is impaired. Memory is impaired.        Judgment: Judgment is impulsive and inappropriate.   Review of Systems  Neurological:  Positive for speech change.  Psychiatric/Behavioral:  Positive for hallucinations and memory loss. The patient has insomnia.   All other systems reviewed and are negative. Blood pressure 115/77, pulse 97, temperature 98.4 F (36.9 C), resp. rate 20, height 5\' 5"  (1.651 m), weight 63.5 kg, SpO2 100 %. Body mass index is 23.3 kg/m.  Treatment Plan Summary: Daily contact with patient to assess and evaluate symptoms and progress in treatment, Medication management, and Plan seek inpatient psychiatric hospitalization.   Disposition: Recommend psychiatric Inpatient admission when medically cleared. Supportive therapy provided about ongoing stressors. Discussed crisis plan, support from social network, calling 911, coming to the Emergency Department, and calling Suicide Hotline.  This service was provided via telemedicine using a 2-way, interactive audio and video technology.  Names of all persons participating in this telemedicine service and their role in this encounter. Name: Role: PMHNP  Name: Maxie Barb Role:  Attending MD  Name: Elisha Ponder Role: patient  Name:  Role:     Loletta Parish, NP 10/13/2021 11:56 AM

## 2021-10-13 NOTE — ED Provider Notes (Signed)
Emergency Medicine Observation Re-evaluation Note  Danny Blackwell is a 51 y.o. male, seen on rounds today.  Pt initially presented to the ED for complaints of AMS.  Pt was initially brought in as a Danny Blackwell, but he has now been identified.  Per nursing staff, he slept very little and has been pacing in the room all day and most of the night.  Pt will not talk to me this morning.  Physical Exam  BP 119/74 (BP Location: Right Arm)    Pulse 87    Temp 97.7 F (36.5 C) (Oral)    Resp 20    Ht 5\' 5"  (1.651 m)    Wt 63.5 kg    SpO2 100%    BMI 23.30 kg/m  Physical Exam General: awake and alert Cardiac: rr Lungs: breathing well Psych: awake but non communicative.  ED Course / MDM  EKG:EKG Interpretation  Date/Time:  Friday October 11 2021 20:38:25 EST Ventricular Rate:  98 PR Interval:  124 QRS Duration: 92 QT Interval:  325 QTC Calculation: 415 R Axis:   70 Text Interpretation: Sinus rhythm Abnormal R-wave progression, early transition Borderline repolarization abnormality Baseline wander in lead(s) I III aVR aVL V1 Confirmed by [id number] [phone]) on 10/12/2021 1:32:33 PM  I have reviewed the labs performed to date as well as medications administered while in observation.  Recent changes in the last 24 hours include we now know his identity.  Plan  Current plan is for re-assessment this morning. Danny Blackwell is under involuntary commitment.      Lamar Laundry, MD 10/13/21 [phone]

## 2021-10-14 NOTE — ED Notes (Signed)
Pt is current is eating his lunch

## 2021-10-14 NOTE — ED Notes (Signed)
Pt was given his breakfast, he is calm and cooperative

## 2021-10-14 NOTE — ED Notes (Addendum)
Patient continues to talk to himself. Patient is paranoid and mentions the government and the [place] frequently in his conversations.

## 2021-10-14 NOTE — ED Notes (Signed)
Patient is pacing around room and talking with no one else in the room. Patient refused to take medications.

## 2021-10-14 NOTE — ED Notes (Signed)
Patient ambulated to restroom and back to room.

## 2021-10-15 NOTE — Progress Notes (Signed)
[facility] has [facility] denied [facility] due to no appropriate beds. Patient meets [facility] inpatient criteria per Dr. Bronwen Betters. Patient has been faxed out to the following facilities:  ? ?[facility]  [address] [place] [address] [phone]  ?[facility]  [address] [place] [place] [address] [phone]  ?[facility]  [address], [place] [address] [phone]  ?[facility]  [address], [place] [address] [phone]  ?[facility]  [address], [place] [address] [phone]  ?[facility]  [address] [place], [place] [address] [phone]  ?[facility]  [address] [place], [place] [address] [phone]  ?[facility]  [address] [place] [address] [phone]  ?[facility]  [address] [place] [address] [phone]  ?[facility]  [address] [place] [address] [phone]  ?[facility]  [address] [place] [address] [phone]  ? ?Damita Dunnings, MSW, LCSW-A  ?10:15 AM 10/15/2021   ?

## 2021-10-15 NOTE — ED Notes (Signed)
The patient was asked if he could take his medications for today, patient mumbling, then said no after being asked if he could take his medications 5 times.  ?

## 2021-10-15 NOTE — Progress Notes (Signed)
[facility] LCSW Progress Note ?  ?10/15/2021    12:06 PM ? ?Danny Blackwell  ? ?[id number]  ? ?Type of Contact and Topic:  Psychiatric Bed Placement  ? ?Pt accepted to [facility]     ? ?Patient meets inpatient criteria per Dr. Serafina Mitchell  ? ?The attending provider will be Jonelle Sports, MD  ? ?Call report to [phone] or [phone] ? ?Lelon Frohlich @ [facility] notified.    ? ?Pt scheduled  to arrive at [facility] ANYTIME.  ? ? ?Mariea Clonts, MSW, LCSW-A  ?12:08 PM 10/15/2021   ?  ? ?  ?  ? ? ? ? ?  ?

## 2021-10-15 NOTE — ED Provider Notes (Signed)
Emergency Medicine Observation Re-evaluation Note ? ?Danny Blackwell is a 51 y.o. male, seen on rounds today.  Pt initially presented to the ED for complaints of No chief complaint on file. ?Currently, the patient is calm, resting. ? ?Physical Exam  ?BP 135/86 (BP Location: Left Arm)   Pulse 75   Temp 98 ?F (36.7 ?C) (Oral)   Resp 17   Ht 5\' 5"  (1.651 m)   Wt 63.5 kg   SpO2 100%   BMI 23.30 kg/m?  ?Physical Exam ?Psych: Cooperative ?No acute distress ? ?ED Course / MDM  ?EKG:EKG Interpretation ? ?Date/Time:  Sunday October 13 2021 10:15:47 EDT ?Ventricular Rate:  91 ?PR Interval:  124 ?QRS Duration: 77 ?QT Interval:  325 ?QTC Calculation: 400 ?R Axis:   76 ?Text Interpretation: Sinus rhythm No significant change since last tracing Confirmed by Danny Blackwell [phone]) on 10/13/2021 10:33:44 AM ? ?I have reviewed the labs performed to date as well as medications administered while in observation.  Recent changes in the last 24 hours include. ? ?Plan  ?Current plan is for patient to be admitted to inpatient psych. ?Danny Blackwell is under involuntary commitment. ?  ? ?  ?Danny Biles, MD ?10/15/21 [phone] ? ?

## 2021-10-15 NOTE — ED Notes (Signed)
Pt resting calmly in bed.  

## 2022-03-25 ENCOUNTER — Other Ambulatory Visit: Payer: Self-pay

## 2022-03-25 ENCOUNTER — Emergency Department (EMERGENCY_DEPARTMENT_HOSPITAL)
Admission: EM | Admit: 2022-03-25 | Discharge: 2022-03-26 | Disposition: A | Discharge status: Other Acute Inpatient Hospital | Payer: No Typology Code available for payment source | Source: Home / Self Care | Attending: Emergency Medicine | Admitting: Emergency Medicine

## 2022-03-25 DIAGNOSIS — F29 Unspecified psychosis not due to a substance or known physiological condition: Secondary | ICD-10-CM | POA: Diagnosis present

## 2022-03-25 DIAGNOSIS — Z20822 Contact with and (suspected) exposure to covid-19: Secondary | ICD-10-CM | POA: Insufficient documentation

## 2022-03-25 LAB — COMPREHENSIVE METABOLIC PANEL
ALT: 10 U/L (ref 0–44)
AST: 16 U/L (ref 15–41)
Albumin: 3.9 g/dL (ref 3.5–5.0)
Alkaline Phosphatase: 37 U/L — ABNORMAL LOW (ref 38–126)
Anion gap: 7 (ref 5–15)
BUN: 16 mg/dL (ref 6–20)
CO2: 24 mmol/L (ref 22–32)
Calcium: 9.2 mg/dL (ref 8.9–10.3)
Chloride: 111 mmol/L (ref 98–111)
Creatinine, Ser: 0.62 mg/dL (ref 0.61–1.24)
GFR, Estimated: >60 mL/min (ref >60)
Glucose, Bld: 103 mg/dL — ABNORMAL HIGH (ref 70–99)
Potassium: 4.8 mmol/L (ref 3.5–5.1)
Sodium: 142 mmol/L (ref 135–145)
Total Bilirubin: 0.3 mg/dL (ref 0.3–1.2)
Total Protein: 6.8 g/dL (ref 6.5–8.1)

## 2022-03-25 LAB — CBC
HCT: 30.1 % — ABNORMAL LOW (ref 39.0–52.0)
Hemoglobin: 8.1 g/dL — ABNORMAL LOW (ref 13.0–17.0)
MCH: 18.7 pg — ABNORMAL LOW (ref 26.0–34.0)
MCHC: 26.9 g/dL — ABNORMAL LOW (ref 30.0–36.0)
MCV: 69.5 fL — ABNORMAL LOW (ref 80.0–100.0)
Platelets: 417 10*3/uL — ABNORMAL HIGH (ref 150–400)
RBC: 4.33 MIL/uL (ref 4.22–5.81)
RDW: 22.1 % — ABNORMAL HIGH (ref 11.5–15.5)
WBC: 6.6 10*3/uL (ref 4.0–10.5)
nRBC: 0 % (ref 0.0–0.2)

## 2022-03-25 LAB — URINE DRUG SCREEN, QUALITATIVE (ARMC ONLY)
Amphetamines, Ur Screen: NOT DETECTED
Barbiturates, Ur Screen: NOT DETECTED
Benzodiazepine, Ur Scrn: NOT DETECTED
Cannabinoid 50 Ng, Ur ~~LOC~~: NOT DETECTED
Cocaine Metabolite,Ur ~~LOC~~: NOT DETECTED
MDMA (Ecstasy)Ur Screen: NOT DETECTED
Methadone Scn, Ur: NOT DETECTED
Opiate, Ur Screen: NOT DETECTED
Phencyclidine (PCP) Ur S: NOT DETECTED
Tricyclic, Ur Screen: NOT DETECTED

## 2022-03-25 LAB — ACETAMINOPHEN LEVEL: Acetaminophen (Tylenol), Serum: <10 ug/mL — ABNORMAL LOW (ref 10–30)

## 2022-03-25 LAB — SALICYLATE LEVEL: Salicylate Lvl: <7 mg/dL — ABNORMAL LOW (ref 7.0–30.0)

## 2022-03-25 LAB — ETHANOL: Alcohol, Ethyl (B): <10 mg/dL (ref <10)

## 2022-03-25 NOTE — ED Triage Notes (Signed)
[place]. Pt here for acute paranoia. States that pt is acting if someone if out to get him. IVC paperwork states that patient is very paranoid and dangerous. Pt's bond was dismissed and pt was released due to. When pt asked questions, pt does not answer but will talk to himself. Pt is very disheveled. Pt is alert. Unknown pt hx or any substance use.

## 2022-03-25 NOTE — BH Assessment (Addendum)
Comprehensive Clinical Assessment (CCA) Screening, Triage and Referral Note  03/25/2022 Danny Blackwell [id number] Recommendations for Services/Supports/Treatments: Consulted with Madaline Brilliant., NP, who determined pt. recommended for overnight observation and reassessment in the AM. Notified Dr. Erma Heritage and Selena Batten, RN of disposition recommendation.   On assessment pt was lying in bed with food strewn around him and on the floor. Pt presented with a disheveled appearance and had normal psychomotor activity. Pt made no eye contact. Pt was nonverbal and refused to participate in the assessment. This writer was unable to complete a full assessment. Chief Complaint:  Chief Complaint  Patient presents with   Psychiatric Evaluation   Visit Diagnosis: Psychosis  Patient Reported Information How did you hear about [place]? Other (Comment) [facility])  What Is the Reason for Your Visit/Call Today? [place] Pt here for acute paranoia. States that pt is acting if someone if out to get him. IVC paperwork states that patient is very paranoid and dangerous. Pt's bond was dismissed and pt was released due to. When pt asked questions, pt does not answer but will talk to himself.  How Long Has This Been Causing You Problems? -- (UTA; Pt is nonverbal)  What Do You Feel Would Help You the Most Today? -- (UTA; Pt is nonverbal)   Have You Recently Had Any Thoughts About Hurting Yourself? -- (UTA; Pt is nonverbal)  Are You Planning to Commit Suicide/Harm Yourself At This time? -- (UTA; Pt is nonverbal)   Have you Recently Had Thoughts About Hurting Someone Else? -- (UTA; Pt is nonverbal)  Are You Planning to Harm Someone at This Time? -- (UTA; Pt is nonverbal)  Explanation: No data recorded  Have You Used Any Alcohol or Drugs in the Past 24 Hours? -- (UTA; Pt is nonverbal)  How Long Ago Did You Use Drugs or Alcohol? No data recorded What Did You Use and How Much? No data  recorded  Do You Currently Have a Therapist/Psychiatrist? -- (UTA; Pt is nonverbal)  Name of Therapist/Psychiatrist: No data recorded  Have You Been Recently Discharged From Any Office Practice or Programs? -- (UTA; Pt is nonverbal)  Explanation of Discharge From Practice/Program: No data recorded   CCA Screening Triage Referral Assessment Type of Contact: Face-to-Face  Telemedicine Service Delivery:   Is this Initial or Reassessment? Initial Assessment  Date Telepsych consult ordered in [facility]:  10/11/21  Time Telepsych consult ordered in [facility]:  1655  Location of Assessment: [facility] ED  Provider Location: [facility] ED   Collateral Involvement: N/A - Patient does not engage in assessment   Does Patient Have a Court Appointed Legal Guardian? No data recorded Name and Contact of Legal Guardian: No data recorded If Minor and Not Living with Parent(s), Who has Custody? n/a  Is CPS involved or ever been involved? -- (UTA; Pt is nonverbal)  Is APS involved or ever been involved? -- (UTA; Pt is nonverbal)   Patient Determined To Be At Risk for Harm To Self or Others Based on Review of Patient Reported Information or Presenting Complaint? -- (UTA; Pt is nonverbal)  Method: No data recorded Availability of Means: No data recorded Intent: No data recorded Notification Required: No data recorded Additional Information for Danger to Others Potential: No data recorded Additional Comments for Danger to Others Potential: No data recorded Are There Guns or Other Weapons in Your Home? No data recorded Types of Guns/Weapons: No data recorded Are These Weapons Safely Secured?  No data recorded Who Could Verify You Are Able To Have These Secured: No data recorded Do You Have any Outstanding Charges, Pending Court Dates, Parole/Probation? No data recorded Contacted To Inform of Risk of Harm To Self or Others: Other: Comment   Does Patient Present under Involuntary  Commitment? Yes  IVC Papers Initial File Date: 03/25/22   [place] of Residence: [place]   Patient Currently Receiving the Following Services: Not Receiving Services   Determination of Need: Emergent (2 hours)   Options For Referral: ED Visit   Discharge Disposition:     Katilyn Miltenberger R Analiz Tvedt, LCAS

## 2022-03-25 NOTE — BH Assessment (Signed)
Patient would not participate during assessment. Patient was whispering but not responding to questions. Will try to assess at a later time.

## 2022-03-25 NOTE — ED Provider Notes (Signed)
[facility] Provider Note    Event Date/Time   First MD Initiated Contact with Patient 03/25/22 1143     (approximate)   History   Psychiatric Evaluation   HPI  Danny Blackwell is a 51 y.o. male who was sent from the jail for acting paranoid.  He is now in room 22.  He is pacing back-and-forth in the room very quickly and mumbling to himself.  He will not let me examine him.  He just moves away from me when I come towards him.  He does reach out to take a box lunch and drink from the nurse.      Physical Exam   Triage Vital Signs: ED Triage Vitals  Enc Vitals Group     BP 03/25/22 1120 102/71     Pulse Rate 03/25/22 1120 93     Resp 03/25/22 1120 18     Temp 03/25/22 1120 98.3 F (36.8 C)     Temp Source 03/25/22 1120 Oral     SpO2 03/25/22 1120 100 %     Weight 03/25/22 1120 145 lb (65.8 kg)     Height 03/25/22 1120 5\' 7"  (1.702 m)     Head Circumference --      Peak Flow --      Pain Score 03/25/22 1130 0     Pain Loc --      Pain Edu? --      Excl. in GC? --     Most recent vital signs: Vitals:   03/25/22 1120  BP: 102/71  Pulse: 93  Resp: 18  Temp: 98.3 F (36.8 C)  SpO2: 100%     General: Awake, no distress.  CV:  Good peripheral perfusion.  Resp:  Normal effort.  Abd:  No distention.  Patient walking and moving normally.   ED Results / Procedures / Treatments   Labs (all labs ordered are listed, but only abnormal results are displayed) Labs Reviewed  CBC - Abnormal; Notable for the following components:      Result Value   Hemoglobin 8.1 (*)    HCT 30.1 (*)    MCV 69.5 (*)    MCH 18.7 (*)    MCHC 26.9 (*)    RDW 22.1 (*)    Platelets 417 (*)    All other components within normal limits  COMPREHENSIVE METABOLIC PANEL  ETHANOL  SALICYLATE LEVEL  ACETAMINOPHEN LEVEL  URINE DRUG SCREEN, QUALITATIVE ([facility] ONLY)     EKG     RADIOLOGY   PROCEDURES:  Critical Care performed:   Procedures   MEDICATIONS ORDERED IN ED: Medications - No data to display   IMPRESSION / MDM / ASSESSMENT AND PLAN / ED COURSE  I reviewed the triage vital signs and the nursing notes. Review of patient's old records show he is done this before he has previously denied drug use.  Talk screen has been negative before.  Differential diagnosis includes, but is not limited to, psychosis due to mental condition.  Possibility for drug use still as they are but less likely.  Patient is clearly unable to survive without supervision currently.  Patient's presentation is most consistent with acute presentation with potential threat to life or bodily function.       FINAL CLINICAL IMPRESSION(S) / ED DIAGNOSES   Final diagnoses:  Psychosis, unspecified psychosis type (HCC)     Rx / DC Orders   ED Discharge Orders     None  Note:  This document was prepared using Dragon voice recognition software and may include unintentional dictation errors.   Arnaldo Natal, MD 03/25/22 [phone]

## 2022-03-25 NOTE — ED Notes (Signed)
Requesting patient to urinate for sample, declines.

## 2022-03-25 NOTE — ED Notes (Signed)
Lunch provided.

## 2022-03-25 NOTE — ED Notes (Signed)
Hospital meal provided, pt tolerated w/o complaints.  Waste discarded appropriately.  

## 2022-03-25 NOTE — ED Notes (Signed)
Given snack. 

## 2022-03-25 NOTE — Consult Note (Signed)
Attempted consult with TTS, patient is uncooperative after several attempts. He will not answer any questions, is whispering quickly, unable to determine what he is saying.Will need to attempt assessment at a later time.   Vanetta Mulders, PMHNP

## 2022-03-25 NOTE — ED Notes (Signed)
Pt belongings include:  1 brown sweat shirt 1 pair of black shoes  1 pair of gray socks 1 pair of khaki pants 1 clear bag of clothes.   2 bags total

## 2022-03-25 NOTE — ED Notes (Signed)
IVC PENDING  CONSULT ?

## 2022-03-25 NOTE — Progress Notes (Signed)
Danny Blackwell is a 51 y.o. male who was sent from the jail for acting paranoid. Psychiatry brief note: Came to see the patient in the emergency room, but he refused to answer any questions. The patient is calm; he had been eating and presenting to be euthymic. This Clinical research associate is unsure if the patient refuses to answer questions because it might keep him in the hospital longer, and he won't have to return to jail, or at least he could be here longer. This Clinical research associate shared with the patient that if he can't participate in the assessment process, he will be discharged back to law enforcement custody.

## 2022-03-26 ENCOUNTER — Inpatient Hospital Stay
Admission: AD | Admit: 2022-03-26 | Discharge: 2022-04-10 | DRG: 885 | Disposition: A | Discharge status: Home / Self Care | Payer: No Typology Code available for payment source | Source: Intra-hospital | Attending: Psychiatry | Admitting: Psychiatry

## 2022-03-26 ENCOUNTER — Other Ambulatory Visit: Payer: Self-pay

## 2022-03-26 ENCOUNTER — Encounter: Payer: Self-pay | Admitting: Psychiatry

## 2022-03-26 DIAGNOSIS — Z20822 Contact with and (suspected) exposure to covid-19: Secondary | ICD-10-CM | POA: Diagnosis present

## 2022-03-26 DIAGNOSIS — F201 Disorganized schizophrenia: Secondary | ICD-10-CM

## 2022-03-26 DIAGNOSIS — F29 Unspecified psychosis not due to a substance or known physiological condition: Secondary | ICD-10-CM | POA: Diagnosis not present

## 2022-03-26 DIAGNOSIS — K259 Gastric ulcer, unspecified as acute or chronic, without hemorrhage or perforation: Secondary | ICD-10-CM | POA: Diagnosis present

## 2022-03-26 DIAGNOSIS — F209 Schizophrenia, unspecified: Principal | ICD-10-CM

## 2022-03-26 DIAGNOSIS — F17210 Nicotine dependence, cigarettes, uncomplicated: Secondary | ICD-10-CM | POA: Diagnosis present

## 2022-03-26 DIAGNOSIS — D649 Anemia, unspecified: Secondary | ICD-10-CM | POA: Diagnosis present

## 2022-03-26 DIAGNOSIS — K219 Gastro-esophageal reflux disease without esophagitis: Secondary | ICD-10-CM | POA: Diagnosis present

## 2022-03-26 DIAGNOSIS — Z5901 Sheltered homelessness: Secondary | ICD-10-CM

## 2022-03-26 DIAGNOSIS — F200 Paranoid schizophrenia: Secondary | ICD-10-CM | POA: Diagnosis not present

## 2022-03-26 DIAGNOSIS — F203 Undifferentiated schizophrenia: Secondary | ICD-10-CM | POA: Diagnosis not present

## 2022-03-26 LAB — RESP PANEL BY RT-PCR (FLU A&B, COVID) ARPGX2
Influenza A by PCR: NEGATIVE
Influenza B by PCR: NEGATIVE
SARS Coronavirus 2 by RT PCR: NEGATIVE

## 2022-03-26 MED ORDER — OLANZAPINE 5 MG PO TABS
2.5000 mg | ORAL_TABLET | Freq: Two times a day (BID) | ORAL | Status: DC
  Filled 2022-03-26: qty 1

## 2022-03-26 MED ORDER — OLANZAPINE 5 MG PO TABS
2.5000 mg | ORAL_TABLET | Freq: Two times a day (BID) | ORAL | Status: DC
  Administered 2022-03-26 – 2022-03-27 (×2): 2.5 mg via ORAL
  Filled 2022-03-26 (×2): qty 1

## 2022-03-26 MED ORDER — ACETAMINOPHEN 325 MG PO TABS: 650.0000 mg | ORAL_TABLET | Freq: Four times a day (QID) | ORAL | Status: DC | PRN

## 2022-03-26 MED ORDER — ALUM & MAG HYDROXIDE-SIMETH 200-200-20 MG/5ML PO SUSP
30.0000 mL | ORAL | Status: DC | PRN
  Administered 2022-04-04: 30 mL via ORAL
  Filled 2022-03-26: qty 30

## 2022-03-26 MED ORDER — LORAZEPAM 1 MG PO TABS
1.0000 mg | ORAL_TABLET | Freq: Once | ORAL | Status: AC
  Administered 2022-03-26: 1 mg via ORAL
  Filled 2022-03-26: qty 1

## 2022-03-26 MED ORDER — HYDROXYZINE HCL 25 MG PO TABS
25.0000 mg | ORAL_TABLET | Freq: Three times a day (TID) | ORAL | Status: DC | PRN
Start: 2022-03-26 — End: 2022-03-26

## 2022-03-26 MED ORDER — MAGNESIUM HYDROXIDE 400 MG/5ML PO SUSP: 30.0000 mL | Freq: Every day | ORAL | Status: DC | PRN

## 2022-03-26 MED ORDER — HYDROXYZINE HCL 25 MG PO TABS
25.0000 mg | ORAL_TABLET | Freq: Three times a day (TID) | ORAL | Status: DC | PRN
  Administered 2022-03-26 – 2022-04-10 (×8): 25 mg via ORAL
  Filled 2022-03-26 (×8): qty 1

## 2022-03-26 NOTE — ED Provider Notes (Signed)
Emergency Medicine Observation Re-evaluation Note  JEX STRAUSBAUGH is a 51 y.o. male, seen on rounds today.  Pt initially presented to the ED for complaints of Psychiatric Evaluation Currently, the patient is resting.  Physical Exam  BP 113/63 (BP Location: Left Arm)   Pulse 89   Temp 98.6 F (37 C)   Resp (!) 28   Ht 1.702 m (5\' 7" )   Wt 65.8 kg   SpO2 100%   BMI 22.71 kg/m  Physical Exam Gen:  No acute distress Resp:  Breathing easily and comfortably, no accessory muscle usage Neuro:  Moving all four extremities, no gross focal neuro deficits Psych:  Resting currently, generally uncooperative when awake   ED Course / MDM  EKG:   I have reviewed the labs performed to date as well as medications administered while in observation.  Recent changes in the last 24 hours include initial EDP evaluation.  Reportedly psychiatry has attempted 3 times to evaluate the patient and the patient will not cooperate  Plan  Current plan is for psychiatry assessment and disposition recommendations. HERON PITCOCK is under involuntary commitment.      Lamar Laundry, MD 03/26/22 [phone]

## 2022-03-26 NOTE — Consult Note (Signed)
[facility] Face-to-Face Psychiatry Consult   Reason for Consult: Psychosis Referring Physician: EDP Patient Identification: Danny Blackwell MRN:  [id number] Principal Diagnosis: Psychosis [facility]) Diagnosis:  Principal Problem:   Psychosis (HCC)   Total Time spent with patient: 20 minutes   HPI: Patient was brought in via police under IVC from the [facility].  He stated he has acute paranoia, and acted like somebody is going to "get him."  Patient's mom was dismissed and he was released due to his behaviors.   Subjective:  ""I know you are a terrorist."   Danny Blackwell is a 51 y.o. male patient admitted with psychosis.  On evaluation, patient is lying in his bed, awake and alert.  Patient is still refusing to answer any particular questions.  I asked him to tell me anything he wanted.  He would only state about that he thinks that I am a terrorist.  He would not/could not tell me why he is in the hospital or if he had any psychiatric diagnoses.  Refused to give me any information about next of kin or who I might call for him to.  He was not aggressive, speaks in a whisper, talks fast.  Mumbles to himself unintelligibly.  He has poor dentition.  He then grabbed his breakfast tray from the floor and started ravenously eating his breakfast sandwich.  Patient has not voided for UDS.  Patient is quite disheveled and thin.  His hair is long and matted.  Patient appears to be psychotic and have psychiatric diagnosis although we have not been able to confirm this.     Attempted to get collateral from mother listed in chart. Baxter Gonzalez [phone]): Person there says wrong number   Past Psychiatric History: Per chart review "trauma, anxiety, depression."  He has presented under involuntary commitment before (January 2022) for auditory and visual hallucinations and delusions.  Reported at that time that he had not bathed in a year.  UDS was negative at that time. He has been seen in the ED  several times in the past for psychosis  Risk to Self:   Risk to Others:   Prior Inpatient Therapy:   Prior Outpatient Therapy:    Past Medical History: No past medical history on file. No past surgical history on file. Family History: No family history on file. Family Psychiatric  History: Unknown Social History:  Social History   Substance and Sexual Activity  Alcohol Use None     Social History   Substance and Sexual Activity  Drug Use Not on file    Social History   Socioeconomic History   Marital status: Unknown    Spouse name: Not on file   Number of children: Not on file   Years of education: Not on file   Highest education level: Not on file  Occupational History   Not on file  Tobacco Use   Smoking status: Unknown   Smokeless tobacco: Not on file  Vaping Use   Vaping Use: Unknown  Substance and Sexual Activity   Alcohol use: Not on file   Drug use: Not on file   Sexual activity: Not on file  Other Topics Concern   Not on file  Social History Narrative   ** Merged History Encounter **       Social Determinants of Health   Financial Resource Strain: Not on file  Food Insecurity: Not on file  Transportation Needs: Not on file  Physical Activity: Not on file  Stress:  Not on file  Social Connections: Not on file   Additional Social History:    Allergies:  No Known Allergies  Labs:  Results for orders placed or performed during the hospital encounter of 03/25/22 (from the past 48 hour(s))  Comprehensive metabolic panel     Status: Abnormal   Collection Time: 03/25/22 11:23 AM  Result Value Ref Range   Sodium 142 135 - 145 mmol/L   Potassium 4.8 3.5 - 5.1 mmol/L   Chloride 111 98 - 111 mmol/L   CO2 24 22 - 32 mmol/L   Glucose, Bld 103 (H) 70 - 99 mg/dL    Comment: Glucose reference range applies only to samples taken after fasting for at least 8 hours.   BUN 16 6 - 20 mg/dL   Creatinine, Ser 9.83 0.61 - 1.24 mg/dL   Calcium 9.2 8.9 - 38.2  mg/dL   Total Protein 6.8 6.5 - 8.1 g/dL   Albumin 3.9 3.5 - 5.0 g/dL   AST 16 15 - 41 U/L   ALT 10 0 - 44 U/L   Alkaline Phosphatase 37 (L) 38 - 126 U/L   Total Bilirubin 0.3 0.3 - 1.2 mg/dL   GFR, Estimated >50 >53 mL/min    Comment: (NOTE) Calculated using the CKD-EPI Creatinine Equation (2021)    Anion gap 7 5 - 15    Comment: Performed at [facility], [address] [place], [place] [address]  Ethanol     Status: None   Collection Time: 03/25/22 11:23 AM  Result Value Ref Range   Alcohol, Ethyl (B) <10 <10 mg/dL    Comment: (NOTE) Lowest detectable limit for serum alcohol is 10 mg/dL.  For medical purposes only. Performed at [facility], [address] [place], [place] [address]   Salicylate level     Status: Abnormal   Collection Time: 03/25/22 11:23 AM  Result Value Ref Range   Salicylate Lvl <7.0 (L) 7.0 - 30.0 mg/dL    Comment: Performed at [facility], [address] [place], [place] [address]  Acetaminophen level     Status: Abnormal   Collection Time: 03/25/22 11:23 AM  Result Value Ref Range   Acetaminophen (Tylenol), Serum <10 (L) 10 - 30 ug/mL    Comment: (NOTE) Therapeutic concentrations vary significantly. A range of 10-30 ug/mL  may be an effective concentration for many patients. However, some  are best treated at concentrations outside of this range. Acetaminophen concentrations >150 ug/mL at 4 hours after ingestion  and >50 ug/mL at 12 hours after ingestion are often associated with  toxic reactions.  Performed at [facility], [address] [place], [place] [address]   cbc     Status: Abnormal   Collection Time: 03/25/22 11:23 AM  Result Value Ref Range   WBC 6.6 4.0 - 10.5 K/uL   RBC 4.33 4.22 - 5.81 MIL/uL   Hemoglobin 8.1 (L) 13.0 - 17.0 g/dL    Comment: Reticulocyte Hemoglobin testing may be clinically indicated, consider ordering this additional test [id number]    HCT 30.1 (L) 39.0 - 52.0 %    MCV 69.5 (L) 80.0 - 100.0 fL   MCH 18.7 (L) 26.0 - 34.0 pg   MCHC 26.9 (L) 30.0 - 36.0 g/dL   RDW 34.1 (H) 96.2 - 22.9 %   Platelets 417 (H) 150 - 400 K/uL   nRBC 0.0 0.0 - 0.2 %    Comment: Performed at [facility], [address] [place],  [place] [address]  Urine Drug Screen, Qualitative     Status: None   Collection Time: 03/25/22  4:44 PM  Result Value Ref Range   Tricyclic, Ur Screen NONE DETECTED NONE DETECTED   Amphetamines, Ur Screen NONE DETECTED NONE DETECTED   MDMA (Ecstasy)Ur Screen NONE DETECTED NONE DETECTED   Cocaine Metabolite,Ur [place] NONE DETECTED NONE DETECTED   Opiate, Ur Screen NONE DETECTED NONE DETECTED   Phencyclidine (PCP) Ur S NONE DETECTED NONE DETECTED   Cannabinoid 50 Ng, Ur [place] NONE DETECTED NONE DETECTED   Barbiturates, Ur Screen NONE DETECTED NONE DETECTED   Benzodiazepine, Ur Scrn NONE DETECTED NONE DETECTED   Methadone Scn, Ur NONE DETECTED NONE DETECTED    Comment: (NOTE) Tricyclics + metabolites, urine    Cutoff 1000 ng/mL Amphetamines + metabolites, urine  Cutoff 1000 ng/mL MDMA (Ecstasy), urine              Cutoff 500 ng/mL Cocaine Metabolite, urine          Cutoff 300 ng/mL Opiate + metabolites, urine        Cutoff 300 ng/mL Phencyclidine (PCP), urine         Cutoff 25 ng/mL Cannabinoid, urine                 Cutoff 50 ng/mL Barbiturates + metabolites, urine  Cutoff 200 ng/mL Benzodiazepine, urine              Cutoff 200 ng/mL Methadone, urine                   Cutoff 300 ng/mL  The urine drug screen provides only a preliminary, unconfirmed analytical test result and should not be used for non-medical purposes. Clinical consideration and professional judgment should be applied to any positive drug screen result due to possible interfering substances. A more specific alternate chemical method must be used in order to obtain a confirmed analytical result. Gas chromatography / mass spectrometry (GC/MS) is the preferred confirm  atory method. Performed at [facility], [address] [place], [place] [address]     No current facility-administered medications for this encounter.   Current Outpatient Medications  Medication Sig Dispense Refill   OLANZapine (ZYPREXA) 2.5 MG tablet Take 1 tablet (2.5 mg total) by mouth 2 (two) times daily. (Patient not taking: Reported on 03/25/2022) 60 tablet 1    Musculoskeletal: Strength & Muscle Tone: within normal limits Gait & Station: normal Patient leans: N/A  Psychiatric Specialty Exam:  Presentation  General Appearance: Bizarre; Disheveled  Eye Contact:Absent  Speech:Garbled  Speech Volume:Decreased  Handedness:Left   Mood and Affect  Mood:Dysphoric  Affect:Blunt; Inappropriate   Thought Process  Thought Processes:Disorganized; Irrevelant  Descriptions of Associations:Loose  Orientation:-- (UTA)  Thought Content:Illogical; Delusions  History of Schizophrenia/Schizoaffective disorder:Yes  Duration of Psychotic Symptoms:Greater than six months  Hallucinations:Hallucinations: -- (WILL NOT ANSWER;APPEARS TO BE RTIS)  Ideas of Reference:Delusions; Paranoia  Suicidal Thoughts:Suicidal Thoughts: -- (UTA)  Homicidal Thoughts:Homicidal Thoughts: -- (UTA)   Sensorium  Memory:-- (WILL NOT RESPOND TO QUESTIONS)  Judgment:Impaired  Insight:None   Executive Functions  Concentration:Poor  Attention Span:Poor  Recall:Poor  Fund of Knowledge:Poor  Language:Poor   Psychomotor Activity  Psychomotor Activity:Psychomotor Activity: Normal   Assets  Assets:Financial Resources/Insurance   Sleep  Sleep:No data recorded  Physical Exam: Physical Exam Vitals and nursing note reviewed.  HENT:     Head: Normocephalic.     Nose: No congestion or rhinorrhea.  Eyes:     General:  Right eye: No discharge.        Left eye: No discharge.  Cardiovascular:     Rate and Rhythm: Normal rate.  Pulmonary:     Effort:  Pulmonary effort is normal.  Musculoskeletal:        General: Normal range of motion.     Cervical back: Normal range of motion.  Skin:    General: Skin is dry.  Neurological:     Mental Status: He is alert and oriented to person, place, and time.  Psychiatric:        Attention and Perception: He is inattentive.        Mood and Affect: Affect is blunt and flat.        Speech: Speech is delayed (whispers,fast, nonsensical).        Behavior: Behavior is withdrawn.        Thought Content: Thought content is paranoid.        Cognition and Memory: Cognition is impaired.        Judgment: Judgment is inappropriate.    Review of Systems  Reason unable to perform ROS: refuses to participate in eval.  Constitutional: Negative.   Respiratory: Negative.    Psychiatric/Behavioral:         Psychosis   Blood pressure 113/63, pulse 89, temperature 98.6 F (37 C), resp. rate (!) 28, height 5\' 7"  (1.702 m), weight 65.8 kg, SpO2 100 %. Body mass index is 22.71 kg/m.  Treatment Plan Summary: Plan recommend psychiatric admission . Reviewed with EDP   Disposition: Recommend psychiatric Inpatient admission when medically cleared.  , NP 03/26/2022 12:18 PM

## 2022-03-26 NOTE — ED Notes (Signed)
Hospital meal provided, pt tolerated w/o complaints.  Waste discarded appropriately.  

## 2022-03-26 NOTE — Progress Notes (Signed)
Pt compliant with medication administration per MD orders

## 2022-03-26 NOTE — ED Notes (Signed)
Pt given dinner tray.

## 2022-03-26 NOTE — BH Assessment (Signed)
Patient is to be admitted to [facility] by Psychiatric Nurse Practitioner  Valetta Mole .  Attending Physician will be Dr.  Toni Amend .   Patient has been assigned to room 315, by [facility] Charge Nurse [place].   Intake Paper Work has been signed and placed on patient chart.  ER staff is aware of the admission: Carlene, ER Secretary   Dr. Derrill Kay, ER MD  Thayer Ohm, Patient's Nurse  Rosey Bath, Patient Access.   Pt can arrive anytime; preferably before 9 PM.

## 2022-03-26 NOTE — Progress Notes (Signed)
Pt irritable and anxious, pacing the halls and having yelling outbursts. NP contacted and PRN medication given per NP orders

## 2022-03-26 NOTE — Plan of Care (Signed)

## 2022-03-26 NOTE — Progress Notes (Signed)
Patient admitted from [facility] - ED, report received from [facility], [place]. Pt presents with a bizarre affect and word salad. Pt refused to answer assessment questions. Pt oriented to the unit and his room, given snack and a drink. Per report, pt came from jail for paranoia. Pt presents with paranoia, but is refusing to speak with staff. Pt refused skin assessment with staff. Pt refused his night medication, pt given education, still refused. Pt being monitored Q 15 minutes for safety per unit protocol. Pt remains safe on the unit.

## 2022-03-26 NOTE — ED Notes (Signed)
Report received from [place], English as a second language teacher. Patient alert and oriented, warm and dry, and in no acute distress. Patient is unable to be understood. Patient made aware of Q15 minute rounds and Psychologist, counselling presence for their safety. Patient instructed to come to this nurse with needs or concerns.

## 2022-03-26 NOTE — ED Notes (Signed)
IVC/pending psych in patient admit  

## 2022-03-26 NOTE — Tx Team (Signed)
Initial Treatment Plan 03/26/2022 8:41 PM Danny Blackwell ZHY:[id number]    PATIENT STRESSORS: Legal issue   Medication change or noncompliance     PATIENT STRENGTHS: General fund of knowledge  Motivation for treatment/growth    PATIENT IDENTIFIED PROBLEMS: Acute Psychosis  Anxiety  Paranoia                 DISCHARGE CRITERIA:  Improved stabilization in mood, thinking, and/or behavior Verbal commitment to aftercare and medication compliance  PRELIMINARY DISCHARGE PLAN: Outpatient therapy Return to previous living arrangement  PATIENT/FAMILY INVOLVEMENT: This treatment plan has been presented to and reviewed with the patient, Danny Blackwell. The patient has been given the opportunity to ask questions and make suggestions.  Elmyra Ricks, RN 03/26/2022, 8:41 PM

## 2022-03-26 NOTE — ED Notes (Addendum)
Patient sitting up in bed. Writer asked patient if he would like Clinical research associate to see if he could get some type of medication to help him relax and possibly rest. Patient talking word salad that writer could not understand. Patient then asked for something to drink. Writer advised I can get him some water. Patient states, "I don't drink water."  Patient advised that the only thing that is given at this time of night. Patient continued to look around room talking work salad and then states, "Bullshit, give me some soda."

## 2022-03-26 NOTE — ED Notes (Signed)
Patient given cup of water.

## 2022-03-26 NOTE — ED Notes (Signed)
Breakfast in patients room, patient is currently sleeping, will obtain vitals when awake.

## 2022-03-27 DIAGNOSIS — F209 Schizophrenia, unspecified: Principal | ICD-10-CM

## 2022-03-27 DIAGNOSIS — F200 Paranoid schizophrenia: Principal | ICD-10-CM | POA: Diagnosis present

## 2022-03-27 DIAGNOSIS — F203 Undifferentiated schizophrenia: Secondary | ICD-10-CM

## 2022-03-27 MED ORDER — LORAZEPAM 2 MG/ML IJ SOLN: 2.0000 mg | Freq: Four times a day (QID) | INTRAMUSCULAR | Status: DC | PRN

## 2022-03-27 MED ORDER — HALOPERIDOL LACTATE 5 MG/ML IJ SOLN: 2.0000 mg | Freq: Two times a day (BID) | INTRAMUSCULAR | Status: DC

## 2022-03-27 MED ORDER — LORAZEPAM 2 MG PO TABS
2.0000 mg | ORAL_TABLET | Freq: Four times a day (QID) | ORAL | Status: DC | PRN
  Administered 2022-03-27 – 2022-04-07 (×5): 2 mg via ORAL
  Filled 2022-03-27 (×5): qty 1

## 2022-03-27 MED ORDER — DIPHENHYDRAMINE HCL 50 MG/ML IJ SOLN
50.0000 mg | Freq: Four times a day (QID) | INTRAMUSCULAR | Status: DC | PRN
Start: 2022-03-27 — End: 2022-04-10
  Filled 2022-03-27: qty 1

## 2022-03-27 MED ORDER — ZIPRASIDONE MESYLATE 20 MG IM SOLR
20.0000 mg | Freq: Two times a day (BID) | INTRAMUSCULAR | Status: DC | PRN
Start: 2022-03-27 — End: 2022-04-10
  Administered 2022-03-29 – 2022-03-31 (×2): 20 mg via INTRAMUSCULAR
  Filled 2022-03-27 (×2): qty 20

## 2022-03-27 MED ORDER — ENSURE ENLIVE PO LIQD
237.0000 mL | Freq: Two times a day (BID) | ORAL | Status: DC
  Administered 2022-03-27 – 2022-04-10 (×29): 237 mL via ORAL

## 2022-03-27 MED ORDER — HALOPERIDOL 1 MG PO TABS
2.0000 mg | ORAL_TABLET | Freq: Two times a day (BID) | ORAL | Status: DC
  Administered 2022-03-27 – 2022-03-29 (×4): 2 mg via ORAL
  Filled 2022-03-27 (×4): qty 2

## 2022-03-27 NOTE — Progress Notes (Signed)
Patient presents with bizarre affect, mumbled speech. Patient observed interacting appropriately with staff and peers on the unit. Pt didn't have any medication scheduled tonight and hasn't requested anything PRN. Pt given education, support, and encouragement to be active in his treatment plan. Pt being monitored Q 15 minutes for safety per unit protocol, remains safe on the unit.  

## 2022-03-27 NOTE — Progress Notes (Signed)
Recreation Therapy Notes  Date: 03/27/2022  Time: 10:35 am    Location: Craft room    Behavioral response: N/A   Intervention Topic: Stress Management   Discussion/Intervention: Patient refused to attend group.   Clinical Observations/Feedback:  Patient refused to attend group.    Rowen Hur LRT/CTRS         [name] 03/27/2022 11:51 AM

## 2022-03-27 NOTE — Progress Notes (Signed)
Pt remains pacing, paranoid, responding to internal stimuli. Pt anxious, suspicious. MD order received for prn for ativan 2mg  po or IM. Pt accepted oral medication. Will con't to monitor.

## 2022-03-27 NOTE — BHH Suicide Risk Assessment (Signed)
[facility] Admission Suicide Risk Assessment   Nursing information obtained from:  Patient Demographic factors:  Male Current Mental Status:  NA Loss Factors:  NA Historical Factors:  NA Risk Reduction Factors:  NA  Total Time spent with patient: 45 minutes Principal Problem: Schizophrenia (HCC) Diagnosis:  Principal Problem:   Schizophrenia (HCC) Active Problems:   Psychosis (HCC)  Subjective Data: Patient seen and chart reviewed.  51 year old man with past history of psychotic episodes brought from jail with reports of psychosis and behavior problems.  Notes reviewed.  There have been intermittent times during this presentation that he has been described as agitated but no report of any really cooperative and productive interviews.  I saw the patient today and he was in his room eyes closed possibly asleep.  Attempted to wake him up.  He would not wake up.  Mumbled a few things and then rolled over.  No report presence of any suicidal behavior.  Labs reviewed.  Drug screen negative  Continued Clinical Symptoms:  Alcohol Use Disorder Identification Test Final Score (AUDIT): 0 The "Alcohol Use Disorders Identification Test", Guidelines for Use in Primary Care, Second Edition.  World Science writer [facility]). Score between 0-7:  no or low risk or alcohol related problems. Score between 8-15:  moderate risk of alcohol related problems. Score between 16-19:  high risk of alcohol related problems. Score 20 or above:  warrants further diagnostic evaluation for alcohol dependence and treatment.   CLINICAL FACTORS:   Schizophrenia:   Paranoid or undifferentiated type   Musculoskeletal: Strength & Muscle Tone: within normal limits Gait & Station: normal Patient leans: N/A  Psychiatric Specialty Exam:  Presentation  General Appearance: Bizarre; Disheveled  Eye Contact:Absent  Speech:Garbled  Speech Volume:Decreased  Handedness:Left   Mood and Affect   Mood:Dysphoric  Affect:Blunt; Inappropriate   Thought Process  Thought Processes:Disorganized; Irrevelant  Descriptions of Associations:Loose  Orientation:-- (UTA)  Thought Content:Illogical; Delusions  History of Schizophrenia/Schizoaffective disorder:Yes  Duration of Psychotic Symptoms:Greater than six months  Hallucinations:Hallucinations: -- (WILL NOT ANSWER;APPEARS TO BE RTIS)  Ideas of Reference:Delusions; Paranoia  Suicidal Thoughts:Suicidal Thoughts: -- (UTA)  Homicidal Thoughts:Homicidal Thoughts: -- (UTA)   Sensorium  Memory:-- (WILL NOT RESPOND TO QUESTIONS)  Judgment:Impaired  Insight:None   Executive Functions  Concentration:Poor  Attention Span:Poor  Recall:Poor  Fund of Knowledge:Poor  Language:Poor   Psychomotor Activity  Psychomotor Activity:Psychomotor Activity: Normal   Assets  Assets:Financial Resources/Insurance   Sleep  Sleep:No data recorded   Physical Exam: Physical Exam Vitals and nursing note reviewed.  Constitutional:      Appearance: Normal appearance. He is ill-appearing.  HENT:     Head: Normocephalic and atraumatic.     Mouth/Throat:     Pharynx: Oropharynx is clear.  Eyes:     Pupils: Pupils are equal, round, and reactive to light.  Cardiovascular:     Rate and Rhythm: Normal rate and regular rhythm.  Pulmonary:     Effort: Pulmonary effort is normal.     Breath sounds: Normal breath sounds.  Abdominal:     General: Abdomen is flat.     Palpations: Abdomen is soft.  Musculoskeletal:        General: Normal range of motion.  Skin:    General: Skin is warm and dry.  Neurological:     General: No focal deficit present.     Mental Status: Mental status is at baseline.  Psychiatric:        Attention and Perception: He is inattentive.  Mood and Affect: Affect is inappropriate.        Speech: He is noncommunicative.    Review of Systems  Unable to perform ROS: Psychiatric disorder   Blood  pressure 111/68, pulse 86, temperature 98.5 F (36.9 C), temperature source Oral, resp. rate 18, height 5\' 7"  (1.702 m), weight 65.8 kg, SpO2 100 %. Body mass index is 22.72 kg/m.   COGNITIVE FEATURES THAT CONTRIBUTE TO RISK:  Closed-mindedness, Loss of executive function, Polarized thinking, and Thought constriction (tunnel vision)    SUICIDE RISK:   Minimal: No identifiable suicidal ideation.  Patients presenting with no risk factors but with morbid ruminations; may be classified as minimal risk based on the severity of the depressive symptoms  PLAN OF CARE: Continue 15-minute checks.  Begin antipsychotic medication.  Try and find ways to see if there is any collateral information.  Review labs.  Ongoing assessment of dangerousness prior to any discharge consideration  I certify that inpatient services furnished can reasonably be expected to improve the patient's condition.   , MD 03/27/2022, 2:01 PM

## 2022-03-27 NOTE — BHH Counselor (Signed)
CSW unable to meet with pt to complete PSA due to pt psychosis and agitation. Pt has been circling the nurses station mumbling, with increasing frequency. CSW will attempt PSA at a later time.   Vilma Meckel. Algis Greenhouse, MSW, LCSW, LCAS 03/27/2022 3:41 PM

## 2022-03-27 NOTE — H&P (Signed)
Psychiatric Admission Assessment Adult  Patient Identification: Danny Blackwell MRN:  [id number] Date of Evaluation:  03/27/2022 Chief Complaint:  Psychosis [facility]) [F29] Principal Diagnosis: Schizophrenia (HCC) Diagnosis:  Principal Problem:   Schizophrenia (HCC) Active Problems:   Psychosis (HCC)  History of Present Illness: Patient seen and chart reviewed.  51 year old man brought from jail under IVC because of dangerous behavior in the jail setting, specifically pacing and acting paranoid.  Patient has not been cooperative with a proper examination so far.  In the emergency room was exhibiting bizarre and agitated psychotic behaviors and intermittent cooperativeness.  Came to see the patient today and he was in bed eyes closed appeared to be asleep.  Could not wake him up.  He muttered a few things I did not understand.  Notes indicate that early this morning he was pacing and agitated and disorganized on the unit.  Labs reviewed.  He is anemic but drug screen is all negative no sign of alcohol.  No collateral information available. Associated Signs/Symptoms: Depression Symptoms:  difficulty concentrating, Duration of Depression Symptoms: No data recorded (Hypo) Manic Symptoms:  Flight of Ideas, Anxiety Symptoms:   Not reported Psychotic Symptoms:  Paranoia, Bizarre disorganized behavior and speech PTSD Symptoms: Negative Total Time spent with patient: 45 minutes  Past Psychiatric History: Patient has been seen several times previously over the last couple years with psychotic symptoms.  No specific diagnosis has been confirmed but the recurrent presentation is suggestive of schizophrenia although could also be psychotic bipolar.  Has had hospitalizations at other facilities but no full hospitalizations in the [facility] system.  No report or evidence of any prior suicide attempts.  By report seems to have responded to antipsychotics with improvement in mental state in the past.  Is the  patient at risk to self? Yes.    Has the patient been a risk to self in the past 6 months? Yes.    Has the patient been a risk to self within the distant past? Yes.    Is the patient a risk to others? No.  Has the patient been a risk to others in the past 6 months? No.  Has the patient been a risk to others within the distant past? No.   [place] Scale:  Flowsheet Row Admission (Current) from 03/26/2022 in [facility] ED from 03/25/2022 in [facility] EMERGENCY DEPART[facility] ED from 10/11/2021 in [facility] EMERGENCY DEPARTMENT  C-SSRS RISK CATEGORY No Risk No Risk No Risk        Prior Inpatient Therapy:   Prior Outpatient Therapy:    Alcohol Screening: 1. How often do you have a drink containing alcohol?: Never 2. How many drinks containing alcohol do you have on a typical day when you are drinking?: 1 or 2 3. How often do you have six or more drinks on one occasion?: Never AUDIT-C Score: 0 4. How often during the last year have you found that you were not able to stop drinking once you had started?: Never 5. How often during the last year have you failed to do what was normally expected from you because of drinking?: Never 6. How often during the last year have you needed a first drink in the morning to get yourself going after a heavy drinking session?: Never 7. How often during the last year have you had a feeling of guilt of remorse after drinking?: Never 8. How often during the last year have you been unable to remember what  happened the night before because you had been drinking?: Never 9. Have you or someone else been injured as a result of your drinking?: No 10. Has a relative or friend or a doctor or another health worker been concerned about your drinking or suggested you cut down?: No Alcohol Use Disorder Identification Test Final Score (AUDIT): 0 Substance Abuse History in the last 12 months:  No. Consequences of Substance  Abuse: Negative Previous Psychotropic Medications: Yes  Psychological Evaluations: Yes  Past Medical History: History reviewed. No pertinent past medical history. History reviewed. No pertinent surgical history. Family History: History reviewed. No pertinent family history. Family Psychiatric  History: Unknown Tobacco Screening:   Social History:  Social History   Substance and Sexual Activity  Alcohol Use None     Social History   Substance and Sexual Activity  Drug Use Not on file    Additional Social History:                           Allergies:  No Known Allergies Lab Results:  Results for orders placed or performed during the hospital encounter of 03/25/22 (from the past 48 hour(s))  Urine Drug Screen, Qualitative     Status: None   Collection Time: 03/25/22  4:44 PM  Result Value Ref Range   Tricyclic, Ur Screen NONE DETECTED NONE DETECTED   Amphetamines, Ur Screen NONE DETECTED NONE DETECTED   MDMA (Ecstasy)Ur Screen NONE DETECTED NONE DETECTED   Cocaine Metabolite,Ur [place] NONE DETECTED NONE DETECTED   Opiate, Ur Screen NONE DETECTED NONE DETECTED   Phencyclidine (PCP) Ur S NONE DETECTED NONE DETECTED   Cannabinoid 50 Ng, Ur [place] NONE DETECTED NONE DETECTED   Barbiturates, Ur Screen NONE DETECTED NONE DETECTED   Benzodiazepine, Ur Scrn NONE DETECTED NONE DETECTED   Methadone Scn, Ur NONE DETECTED NONE DETECTED    Comment: (NOTE) Tricyclics + metabolites, urine    Cutoff 1000 ng/mL Amphetamines + metabolites, urine  Cutoff 1000 ng/mL MDMA (Ecstasy), urine              Cutoff 500 ng/mL Cocaine Metabolite, urine          Cutoff 300 ng/mL Opiate + metabolites, urine        Cutoff 300 ng/mL Phencyclidine (PCP), urine         Cutoff 25 ng/mL Cannabinoid, urine                 Cutoff 50 ng/mL Barbiturates + metabolites, urine  Cutoff 200 ng/mL Benzodiazepine, urine              Cutoff 200 ng/mL Methadone, urine                   Cutoff 300 ng/mL  The urine  drug screen provides only a preliminary, unconfirmed analytical test result and should not be used for non-medical purposes. Clinical consideration and professional judgment should be applied to any positive drug screen result due to possible interfering substances. A more specific alternate chemical method must be used in order to obtain a confirmed analytical result. Gas chromatography / mass spectrometry (GC/MS) is the preferred confirm atory method. Performed at [facility], [address] [place], [place] [address]   Resp Panel by RT-PCR (Flu A&B, Covid) Anterior Nasal Swab     Status: None   Collection Time: 03/26/22  1:16 PM   Specimen: Anterior Nasal Swab  Result Value Ref Range   SARS  Coronavirus 2 by RT PCR NEGATIVE NEGATIVE    Comment: (NOTE) SARS-CoV-2 target nucleic acids are NOT DETECTED.  The SARS-CoV-2 RNA is generally detectable in upper respiratory specimens during the acute phase of infection. The lowest concentration of SARS-CoV-2 viral copies this assay can detect is 138 copies/mL. A negative result does not preclude SARS-Cov-2 infection and should not be used as the sole basis for treatment or other patient management decisions. A negative result may occur with  improper specimen collection/handling, submission of specimen other than nasopharyngeal swab, presence of viral mutation(s) within the areas targeted by this assay, and inadequate number of viral copies(<138 copies/mL). A negative result must be combined with clinical observations, patient history, and epidemiological information. The expected result is Negative.  Fact Sheet for Patients:  BloggerCourse.com  Fact Sheet for Healthcare Providers:  SeriousBroker.it  This test is no t yet approved or cleared by the [place] FDA and  has been authorized for detection and/or diagnosis of SARS-CoV-2 by FDA under an Emergency Use  Authorization (EUA). This EUA will remain  in effect (meaning this test can be used) for the duration of the COVID-19 declaration under Section 564(b)(1) of the Act, 21 U.S.C.section 360bbb-3(b)(1), unless the authorization is terminated  or revoked sooner.       Influenza A by PCR NEGATIVE NEGATIVE   Influenza B by PCR NEGATIVE NEGATIVE    Comment: (NOTE) The Xpert Xpress SARS-CoV-2/FLU/RSV plus assay is intended as an aid in the diagnosis of influenza from Nasopharyngeal swab specimens and should not be used as a sole basis for treatment. Nasal washings and aspirates are unacceptable for Xpert Xpress SARS-CoV-2/FLU/RSV testing.  Fact Sheet for Patients: BloggerCourse.com  Fact Sheet for Healthcare Providers: SeriousBroker.it  This test is not yet approved or cleared by the [place] FDA and has been authorized for detection and/or diagnosis of SARS-CoV-2 by FDA under an Emergency Use Authorization (EUA). This EUA will remain in effect (meaning this test can be used) for the duration of the COVID-19 declaration under Section 564(b)(1) of the Act, 21 U.S.C. section 360bbb-3(b)(1), unless the authorization is terminated or revoked.  Performed at [facility], [address] [place], [place] [address]     Blood Alcohol level:  Lab Results  Component Value Date   [facility] <10 03/25/2022   ETH <10 10/11/2021    Metabolic Disorder Labs:  No results found for: "HGBA1C", "MPG" No results found for: "PROLACTIN" No results found for: "CHOL", "TRIG", "HDL", "CHOLHDL", "VLDL", "LDLCALC"  Current Medications: Current Facility-Administered Medications  Medication Dose Route Frequency Provider Last Rate Last Admin   acetaminophen (TYLENOL) tablet 650 mg  650 mg Oral Q6H PRN Vanetta Mulders, NP       alum & mag hydroxide-simeth (MAALOX/MYLANTA) 200-200-20 MG/5ML suspension 30 mL  30 mL Oral Q4H PRN Gabriel Cirri F,  NP       diphenhydrAMINE (BENADRYL) injection 50 mg  50 mg Intravenous Q6H PRN Anel Creighton T, MD       feeding supplement (ENSURE ENLIVE / ENSURE PLUS) liquid 237 mL  237 mL Oral BID BM Darnise Montag T, MD   237 mL at 03/27/22 1347   haloperidol (HALDOL) tablet 2 mg  2 mg Oral BID Judithe Keetch T, MD       Or   haloperidol lactate (HALDOL) injection 2 mg  2 mg Intramuscular BID Hendrix Yurkovich, Jackquline Denmark, MD       hydrOXYzine (ATARAX) tablet 25 mg  25 mg Oral TID PRN  Vanetta MuldersBarthold, Louise F, NP   25 mg at 03/27/22 [id number]   magnesium hydroxide (MILK OF MAGNESIA) suspension 30 mL  30 mL Oral Daily PRN Vanetta MuldersBarthold, Louise F, NP       ziprasidone (GEODON) injection 20 mg  20 mg Intramuscular Q12H PRN Keshon Markovitz, Jackquline DenmarkJohn T, MD       PTA Medications: Medications Prior to Admission  Medication Sig Dispense Refill Last Dose   OLANZapine (ZYPREXA) 2.5 MG tablet Take 1 tablet (2.5 mg total) by mouth 2 (two) times daily. (Patient not taking: Reported on 03/25/2022) 60 tablet 1     Musculoskeletal: Strength & Muscle Tone: within normal limits Gait & Station: normal Patient leans: N/A            Psychiatric Specialty Exam:  Presentation  General Appearance: Bizarre; Disheveled  Eye Contact:Absent  Speech:Garbled  Speech Volume:Decreased  Handedness:Left   Mood and Affect  Mood:Dysphoric  Affect:Blunt; Inappropriate   Thought Process  Thought Processes:Disorganized; Irrevelant  Duration of Psychotic Symptoms: Greater than six months  Past Diagnosis of Schizophrenia or Psychoactive disorder: Yes  Descriptions of Associations:Loose  Orientation:-- (UTA)  Thought Content:Illogical; Delusions  Hallucinations:Hallucinations: -- (WILL NOT ANSWER;APPEARS TO BE RTIS)  Ideas of Reference:Delusions; Paranoia  Suicidal Thoughts:Suicidal Thoughts: -- (UTA)  Homicidal Thoughts:Homicidal Thoughts: -- (UTA)   Sensorium  Memory:-- (WILL NOT RESPOND TO  QUESTIONS)  Judgment:Impaired  Insight:None   Executive Functions  Concentration:Poor  Attention Span:Poor  Recall:Poor  Fund of Knowledge:Poor  Language:Poor   Psychomotor Activity  Psychomotor Activity:Psychomotor Activity: Normal   Assets  Assets:Financial Resources/Insurance   Sleep  Sleep:No data recorded   Physical Exam: Physical Exam Vitals reviewed.  Constitutional:      Appearance: Normal appearance. He is ill-appearing.  HENT:     Head: Normocephalic and atraumatic.     Mouth/Throat:     Pharynx: Oropharynx is clear.  Eyes:     Pupils: Pupils are equal, round, and reactive to light.  Cardiovascular:     Rate and Rhythm: Normal rate and regular rhythm.  Pulmonary:     Effort: Pulmonary effort is normal.     Breath sounds: Normal breath sounds.  Abdominal:     General: Abdomen is flat.     Palpations: Abdomen is soft.  Musculoskeletal:        General: Normal range of motion.  Skin:    General: Skin is warm and dry.  Neurological:     General: No focal deficit present.     Mental Status: He is alert. Mental status is at baseline.  Psychiatric:        Attention and Perception: He is inattentive.        Mood and Affect: Mood normal. Affect is blunt.        Speech: He is noncommunicative.    Review of Systems  Unable to perform ROS: Psychiatric disorder   Blood pressure 111/68, pulse 86, temperature 98.5 F (36.9 C), temperature source Oral, resp. rate 18, height 5\' 7"  (1.702 m), weight 65.8 kg, SpO2 100 %. Body mass index is 22.72 kg/m.  Treatment Plan Summary: Medication management and Plan .  Disheveled and poorly groomed man remains uncooperative with examination.  History of recurrent psychotic symptoms.  Description of symptoms that brought him to the hospital consistent with his psychosis.  I am going to go ahead and put forth a best estimate of diagnosis as schizophrenia for now.  Orders changed to haloperidol p.o. or IM for psychotic  symptoms.  As needed medicines  available for agitation.  Labs reviewed.  If possible we will check hemoglobin A1c and lipid panel.  I have gone through the chart without any success in finding a way to contact family.  I found 1 note that mentioned a sister by name with the phone number but that phone number is not working.  Ongoing assessment and gathering information including assessment of dangerousness prior to discharge planning  Observation Level/Precautions:  15 minute checks  Laboratory:  UDS  Psychotherapy:    Medications:    Consultations:    Discharge Concerns:    Estimated LOS:  Other:     Physician Treatment Plan for Primary Diagnosis: Schizophrenia (HCC) Long Term Goal(s): Improvement in symptoms so as ready for discharge  Short Term Goals: Ability to verbalize feelings will improve, Ability to demonstrate self-control will improve, and Ability to identify and develop effective coping behaviors will improve  Physician Treatment Plan for Secondary Diagnosis: Principal Problem:   Schizophrenia (HCC) Active Problems:   Psychosis (HCC)  Long Term Goal(s): Improvement in symptoms so as ready for discharge  Short Term Goals: Ability to maintain clinical measurements within normal limits will improve and Compliance with prescribed medications will improve  I certify that inpatient services furnished can reasonably be expected to improve the patient's condition.    Mordecai Rasmussen, MD 8/24/20232:03 PM

## 2022-03-27 NOTE — Progress Notes (Signed)
Patient presents on the unit with garbled, incomprehensible speech. Patient is visibly seen responding to internal stimuli. He is disheveled with very poor hygiene and large mat to the back of his hair. Upon approach he would not respond to writer or open his eyes and he was closing his eyes and mumbling to auditory hallucinations and internal stimuli. He finally, with many prompts did agree to take medication but writer had to pour pill cup into his mouth. He has been seen on the unit in the dayroom, pacing up and down hallway and irritable with MHT on the unit questioning his name and paranoid response with MHT. Prn vistaril given to help with anxiety. Pt also with obvious poor po intake ensure ordered per protocol. Above discussed in treatment team and MD aware. Will con't to monitor q 15 minutes for safety.

## 2022-03-27 NOTE — Group Note (Signed)
[facility] LCSW Group Therapy Note   Group Date: 03/27/2022 Start Time: 1300 End Time: 1400   Type of Therapy/Topic:  Group Therapy:  Balance in Life  Participation Level:  Did Not Attend   Description of Group:    This group will address the concept of balance and how it feels and looks when one is unbalanced. Patients will be encouraged to process areas in their lives that are out of balance, and identify reasons for remaining unbalanced. Facilitators will guide patients utilizing problem- solving interventions to address and correct the stressor making their life unbalanced. Understanding and applying boundaries will be explored and addressed for obtaining  and maintaining a balanced life. Patients will be encouraged to explore ways to assertively make their unbalanced needs known to significant others in their lives, using other group members and facilitator for support and feedback.  Therapeutic Goals: Patient will identify two or more emotions or situations they have that consume much of in their lives. Patient will identify signs/triggers that life has become out of balance:  Patient will identify two ways to set boundaries in order to achieve balance in their lives:  Patient will demonstrate ability to communicate their needs through discussion and/or role plays  Summary of Patient Progress: X   Therapeutic Modalities:   Cognitive Behavioral Therapy Solution-Focused Therapy Assertiveness Training   Mert Dietrick R Taylia Berber, LCSW 

## 2022-03-27 NOTE — Progress Notes (Signed)
Pt increasingly agitated, pacing, then demanding something to drink. Writer offered ice water , juice and bottled water and gatorade to which pt refused stating it '' was all poison. ' He is disorganized, pacing down the wrong hallway. Prn vistaril given in addition to scheduled pm medications and notified MD for additional prn as pt is taking oral medications at this time and only IM prn geodon available. Pt when asked for mouth check after taking pills said '' get the fuck out of my face ! And pointed in threatening manner. '' Will con't to monitor.

## 2022-03-27 NOTE — [facility] Group Notes (Signed)
[facility] Group Notes:  (Nursing/MHT/Case Management/Adjunct)  Date:  03/27/2022  Time:  10:48 AM  Type of Therapy:   community meeting  Participation Level:  Did Not Attend  Rodena Goldmann 03/27/2022, 10:48 AM

## 2022-03-28 DIAGNOSIS — F203 Undifferentiated schizophrenia: Secondary | ICD-10-CM | POA: Diagnosis not present

## 2022-03-28 LAB — LIPID PANEL
Cholesterol: 116 mg/dL (ref 0–200)
HDL: 44 mg/dL (ref >40)
LDL Cholesterol: 57 mg/dL (ref 0–99)
Total CHOL/HDL Ratio: 2.6 RATIO
Triglycerides: 76 mg/dL (ref <150)
VLDL: 15 mg/dL (ref 0–40)

## 2022-03-28 LAB — HEMOGLOBIN A1C
Hgb A1c MFr Bld: 5 % (ref 4.8–5.6)
Mean Plasma Glucose: 96.8 mg/dL

## 2022-03-28 NOTE — [facility] Counselor (Addendum)
CSW attempted to meet with pt regarding completion of PSA. CSW spoke to pt, calling him by name; pt looked at CSW and walked away. CSW will attempt assessment at a later date.   Vilma Meckel. Algis Greenhouse, MSW, LCSW, LCAS 03/28/2022 3:40 PM

## 2022-03-28 NOTE — Progress Notes (Signed)
Recreation Therapy Notes  INPATIENT RECREATION THERAPY ASSESSMENT  Patient Details Name: RHYS LICHTY MRN: [id number] DOB: 1971/03/07 Today's Date: 03/28/2022       Information Obtained From: Patient (Patient a gave a blank stare and did not respond when asked to complete the assessment)  Able to Participate in Assessment/Interview:    Patient Presentation:    Reason for Admission (Per Patient):    Patient Stressors:    Coping Skills:      Leisure Interests (2+):     Frequency of Recreation/Participation:    Awareness of Community Resources:     [employer]:     Current Use:    If no, Barriers?:    Expressed Interest in [employer] Information:    [place] of Residence:     Patient Main Form of Transportation:    Patient Strengths:     Patient Identified Areas of Improvement:     Patient Goal for Hospitalization:     Current SI (including self-harm):     Current HI:     Current AVH:    Staff Intervention Plan:    Consent to Intern Participation:    Norene Oliveri 03/28/2022, 1:19 PM

## 2022-03-28 NOTE — Progress Notes (Signed)
Recreation Therapy Notes  Date: 03/28/2022   Time: 10:50 am     Location: Courtyard  Behavioral response: Isolated, Not engaged   Intervention Topic:  Social skills   Discussion/Intervention:  Group content on today was focused on social skills. The group defined social skills and identified ways they use social skills. Patients expressed what obstacles they face when trying to be social. Participants described the importance of social skills. The group listed ways to improve social skills and reasons to improve social skills. Individuals had an opportunity to learn new and improve social skills as well as identify their weaknesses. Clinical Observations/Feedback: Patient came to group late and began pulling on the doors in the court yard looking for a way out. After several failed attempts patient eventually took a seat and kept to himself.  Dessire Grimes LRT/CTRS        Azure Budnick 03/28/2022 1:13 PM

## 2022-03-28 NOTE — Plan of Care (Signed)
D- Patient alert and oriented to person. Patient presented in a suspicious/preoccupied mood on assessment. Patient has not talked to this Clinical research associate, anytime this Clinical research associate goes up to patient to try and assess him, he just looks at Conservation officer, nature and says nothing. Patient has also not allowed staff to obtain a set of vitals on him. This writer is unable to assess if patient is experiencing SI, HI, AVH, and pain, however, patient does not appear to be in any distress nor pain. Patient has also not voiced if he is experiencing any signs/symptoms of depression/anxiety. Patient had no stated goals for today.  A- Scheduled medications administered to patient, per MD orders. Support and encouragement provided.  Routine safety checks conducted every 15 minutes.  Patient informed to notify staff with problems or concerns.  R- No adverse drug reactions noted. Patient compliant with medications, after staff encouragement. Patient remains safe at this time.  Problem: Education: Goal: Knowledge of General Education information will improve Description: Including pain rating scale, medication(s)/side effects and non-pharmacologic comfort measures Outcome: Not Progressing   Problem: Health Behavior/Discharge Planning: Goal: Ability to manage health-related needs will improve Outcome: Not Progressing   Problem: Clinical Measurements: Goal: Ability to maintain clinical measurements within normal limits will improve Outcome: Not Progressing Goal: Will remain free from infection Outcome: Not Progressing Goal: Diagnostic test results will improve Outcome: Not Progressing Goal: Respiratory complications will improve Outcome: Not Progressing Goal: Cardiovascular complication will be avoided Outcome: Not Progressing   Problem: Activity: Goal: Risk for activity intolerance will decrease Outcome: Not Progressing   Problem: Nutrition: Goal: Adequate nutrition will be maintained Outcome: Not Progressing   Problem:  Coping: Goal: Level of anxiety will decrease Outcome: Not Progressing   Problem: Elimination: Goal: Will not experience complications related to bowel motility Outcome: Not Progressing Goal: Will not experience complications related to urinary retention Outcome: Not Progressing   Problem: Pain Managment: Goal: General experience of comfort will improve Outcome: Not Progressing   Problem: Safety: Goal: Ability to remain free from injury will improve Outcome: Not Progressing   Problem: Skin Integrity: Goal: Risk for impaired skin integrity will decrease Outcome: Not Progressing

## 2022-03-28 NOTE — BH IP Treatment Plan (Signed)
Interdisciplinary Treatment and Diagnostic Plan Update  03/28/2022 Time of Session: 09:42 Danny Blackwell MRN: [id number]  Principal Diagnosis: Schizophrenia [facility])  Secondary Diagnoses: Principal Problem:   Schizophrenia ([place]) Active Problems:   Psychosis ([place])   Current Medications:  Current Facility-Administered Medications  Medication Dose Route Frequency Provider Last Rate Last Admin   acetaminophen (TYLENOL) tablet 650 mg  650 mg Oral Q6H PRN Sherlon Handing, NP       alum & mag hydroxide-simeth (MAALOX/MYLANTA) 200-200-20 MG/5ML suspension 30 mL  30 mL Oral Q4H PRN Sherlon Handing, NP       diphenhydrAMINE (BENADRYL) injection 50 mg  50 mg Intravenous Q6H PRN Clapacs, Madie Reno, MD       feeding supplement (ENSURE ENLIVE / ENSURE PLUS) liquid 237 mL  237 mL Oral BID BM Clapacs, John T, MD   237 mL at 03/28/22 1001   haloperidol (HALDOL) tablet 2 mg  2 mg Oral BID Clapacs, Madie Reno, MD   2 mg at 03/28/22 4008   Or   haloperidol lactate (HALDOL) injection 2 mg  2 mg Intramuscular BID Clapacs, Madie Reno, MD       hydrOXYzine (ATARAX) tablet 25 mg  25 mg Oral TID PRN Sherlon Handing, NP   25 mg at 03/27/22 1542   LORazepam (ATIVAN) tablet 2 mg  2 mg Oral Q6H PRN Clapacs, Madie Reno, MD   2 mg at 03/27/22 1657   Or   LORazepam (ATIVAN) injection 2 mg  2 mg Intramuscular Q6H PRN Clapacs, Madie Reno, MD       magnesium hydroxide (MILK OF MAGNESIA) suspension 30 mL  30 mL Oral Daily PRN Waldon Merl F, NP       ziprasidone (GEODON) injection 20 mg  20 mg Intramuscular Q12H PRN Clapacs, Madie Reno, MD       PTA Medications: Medications Prior to Admission  Medication Sig Dispense Refill Last Dose   OLANZapine (ZYPREXA) 2.5 MG tablet Take 1 tablet (2.5 mg total) by mouth 2 (two) times daily. (Patient not taking: Reported on 03/25/2022) 60 tablet 1     Patient Stressors: Legal issue   Medication change or noncompliance    Patient Strengths: Psychologist, clinical for  treatment/growth   Treatment Modalities: Medication Management, Group therapy, Case management,  1 to 1 session with clinician, Psychoeducation, Recreational therapy.   Physician Treatment Plan for Primary Diagnosis: Schizophrenia ([place]) Long Term Goal(s): Improvement in symptoms so as ready for discharge   Short Term Goals: Ability to maintain clinical measurements within normal limits will improve Compliance with prescribed medications will improve Ability to verbalize feelings will improve Ability to demonstrate self-control will improve Ability to identify and develop effective coping behaviors will improve  Medication Management: Evaluate patient's response, side effects, and tolerance of medication regimen.  Therapeutic Interventions: 1 to 1 sessions, Unit Group sessions and Medication administration.  Evaluation of Outcomes: Not Met  Physician Treatment Plan for Secondary Diagnosis: Principal Problem:   Schizophrenia ([place]) Active Problems:   Psychosis ([place])  Long Term Goal(s): Improvement in symptoms so as ready for discharge   Short Term Goals: Ability to maintain clinical measurements within normal limits will improve Compliance with prescribed medications will improve Ability to verbalize feelings will improve Ability to demonstrate self-control will improve Ability to identify and develop effective coping behaviors will improve     Medication Management: Evaluate patient's response, side effects, and tolerance of medication regimen.  Therapeutic Interventions: 1 to 1 sessions,  Unit Group sessions and Medication administration.  Evaluation of Outcomes: Not Met   RN Treatment Plan for Primary Diagnosis: Schizophrenia ([place]) Long Term Goal(s): Knowledge of disease and therapeutic regimen to maintain health will improve  Short Term Goals: Ability to remain free from injury will improve, Ability to verbalize frustration and anger appropriately will improve, Ability to  demonstrate self-control, Ability to participate in decision making will improve, Ability to verbalize feelings will improve, Ability to disclose and discuss suicidal ideas, Ability to identify and develop effective coping behaviors will improve, and Compliance with prescribed medications will improve  Medication Management: RN will administer medications as ordered by provider, will assess and evaluate patient's response and provide education to patient for prescribed medication. RN will report any adverse and/or side effects to prescribing provider.  Therapeutic Interventions: 1 on 1 counseling sessions, Psychoeducation, Medication administration, Evaluate responses to treatment, Monitor vital signs and CBGs as ordered, Perform/monitor CIWA, COWS, AIMS and Fall Risk screenings as ordered, Perform wound care treatments as ordered.  Evaluation of Outcomes: Not Met   LCSW Treatment Plan for Primary Diagnosis: Schizophrenia ([place]) Long Term Goal(s): Safe transition to appropriate next level of care at discharge, Engage patient in therapeutic group addressing interpersonal concerns.  Short Term Goals: Engage patient in aftercare planning with referrals and resources, Increase social support, Increase ability to appropriately verbalize feelings, Increase emotional regulation, Facilitate acceptance of mental health diagnosis and concerns, and Increase skills for wellness and recovery  Therapeutic Interventions: Assess for all discharge needs, 1 to 1 time with Social worker, Explore available resources and support systems, Assess for adequacy in community support network, Educate family and significant other(s) on suicide prevention, Complete Psychosocial Assessment, Interpersonal group therapy.  Evaluation of Outcomes: Not Met   Progress in Treatment: Attending groups: No. Participating in groups: No. Taking medication as prescribed: Yes. Toleration medication: Yes. Family/Significant other contact  made: Yes, individual(s) contacted:  when given permission. Patient understands diagnosis: No. Discussing patient identified problems/goals with staff: No. Medical problems stabilized or resolved: Yes. Denies suicidal/homicidal ideation: No. Issues/concerns per patient self-inventory: No. Other: none.  New problem(s) identified: No, Describe:  none identified.  New Short Term/Long Term Goal(s): elimination of symptoms of psychosis, medication management for mood stabilization; elimination of SI thoughts; development of comprehensive mental wellness plan.  Patient Goals:  Pt declined to participate in treatment team meeting despite individual extended invitation.   Discharge Plan or Barriers: CSW will assist pt with development of an appropriate aftercare/discharge plan.   Reason for Continuation of Hospitalization: Medication stabilization Other; describe psychosis  Estimated Length of Stay: 1-7 days  Last 3 [place] Suicide Severity Risk Score: [place] Admission (Current) from 03/26/2022 in [place] ED from 03/25/2022 in [place] ED from 10/11/2021 in [place] No Risk No Risk No Risk       Last PHQ 2/9 Scores:     No data to display          Scribe for Treatment Team: Shirl Harris, LCSW 03/28/2022 10:10 AM

## 2022-03-28 NOTE — Progress Notes (Signed)
Recreation Therapy Notes  INPATIENT RECREATION TR PLAN  Patient Details Name: MONTAE STAGER MRN: [id number] DOB: Oct 05, 1970 Today's Date: 03/28/2022  Rec Therapy Plan Is patient appropriate for Therapeutic Recreation?: Yes Treatment times per week: at least 3 Estimated Length of Stay: 5-7 days TR Treatment/Interventions: Group participation (Comment)  Discharge Criteria Pt will be discharged from therapy if:: Discharged Treatment plan/goals/alternatives discussed and agreed upon by:: Patient/family  Discharge Summary     Rolando Whitby 03/28/2022, 1:19 PM

## 2022-03-28 NOTE — Progress Notes (Signed)
[facility] MD Progress Note  03/28/2022 3:05 PM Danny Blackwell  MRN:  [id number] Subjective: Follow-up with patient with likely schizophrenia.  Patient did agree to come to my office to talk but would not sit down.  Made almost no eye contact.  Spent the whole time while I was trying to talk with him staring at various random spots in the room.  Muttering to himself.  The only answer he gave to questions was to say that all he needed was his shoes and his wallet and he would be on his way.  Has not really interacted much with anyone else here as well but he is eating and has not been violent today Principal Problem: Schizophrenia ([place]) Diagnosis: Principal Problem:   Schizophrenia ([place]) Active Problems:   Psychosis ([place])  Total Time spent with patient: 30 minutes  Past Psychiatric History: Past history of psychosis probably schizophrenia  Past Medical History: History reviewed. No pertinent past medical history. History reviewed. No pertinent surgical history. Family History: History reviewed. No pertinent family history. Family Psychiatric  History: See previous Social History:  Social History   Substance and Sexual Activity  Alcohol Use None     Social History   Substance and Sexual Activity  Drug Use Not on file    Social History   Socioeconomic History   Marital status: Unknown    Spouse name: Not on file   Number of children: Not on file   Years of education: Not on file   Highest education level: Not on file  Occupational History   Not on file  Tobacco Use   Smoking status: Every Day    Packs/day: 1.00    Years: 15.00    Total pack years: 15.00    Types: Cigarettes   Smokeless tobacco: Never  Vaping Use   Vaping Use: Unknown  Substance and Sexual Activity   Alcohol use: Not on file   Drug use: Not on file   Sexual activity: Not on file  Other Topics Concern   Not on file  Social History Narrative   ** Merged History Encounter **       Social Determinants of  Health   Financial Resource Strain: Not on file  Food Insecurity: Not on file  Transportation Needs: Not on file  Physical Activity: Not on file  Stress: Not on file  Social Connections: Not on file   Additional Social History:                         Sleep: Fair  Appetite:  Fair  Current Medications: Current Facility-Administered Medications  Medication Dose Route Frequency Provider Last Rate Last Admin   acetaminophen (TYLENOL) tablet 650 mg  650 mg Oral Q6H PRN Sherlon Handing, NP       alum & mag hydroxide-simeth (MAALOX/MYLANTA) 200-200-20 MG/5ML suspension 30 mL  30 mL Oral Q4H PRN Waldon Merl F, NP       diphenhydrAMINE (BENADRYL) injection 50 mg  50 mg Intravenous Q6H PRN Chantae Soo T, MD       feeding supplement (ENSURE ENLIVE / ENSURE PLUS) liquid 237 mL  237 mL Oral BID BM Lucca Greggs T, MD   237 mL at 03/28/22 1435   haloperidol (HALDOL) tablet 2 mg  2 mg Oral BID Amjad Fikes, Madie Reno, MD   2 mg at 03/28/22 [id number]   Or   haloperidol lactate (HALDOL) injection 2 mg  2 mg Intramuscular BID Khyli Swaim T,  MD       hydrOXYzine (ATARAX) tablet 25 mg  25 mg Oral TID PRN Vanetta Mulders, NP   25 mg at 03/27/22 1542   LORazepam (ATIVAN) tablet 2 mg  2 mg Oral Q6H PRN Vincent Ehrler, Jackquline Denmark, MD   2 mg at 03/27/22 1657   Or   LORazepam (ATIVAN) injection 2 mg  2 mg Intramuscular Q6H PRN Jassiel Flye, Jackquline [place], MD       magnesium hydroxide (MILK OF MAGNESIA) suspension 30 mL  30 mL Oral Daily PRN Vanetta Mulders, NP       ziprasidone (GEODON) injection 20 mg  20 mg Intramuscular Q12H PRN Roxi Hlavaty, Jackquline Denmark, MD        Lab Results:  Results for orders placed or performed during the hospital encounter of 03/26/22 (from the past 48 hour(s))  Hemoglobin A1c     Status: None   Collection Time: 03/28/22  6:36 AM  Result Value Ref Range   Hgb A1c MFr Bld 5.0 4.8 - 5.6 %    Comment: (NOTE) Pre diabetes:          5.7%-6.4%  Diabetes:              >6.4%  Glycemic control for    <7.0% adults with diabetes    Mean Plasma Glucose 96.8 mg/dL    Comment: Performed at [facility], [address] [place], [place] [address]  Lipid panel     Status: None   Collection Time: 03/28/22  6:36 AM  Result Value Ref Range   Cholesterol 116 0 - 200 mg/dL   Triglycerides 76 <093 mg/dL   HDL 44 >81 mg/dL   Total CHOL/HDL Ratio 2.6 RATIO   VLDL 15 0 - 40 mg/dL   LDL Cholesterol 57 0 - 99 mg/dL    Comment:        Total Cholesterol/HDL:CHD Risk Coronary Heart Disease Risk Table                     Men   Women  1/2 Average Risk   3.4   3.3  Average Risk       5.0   4.4  2 X Average Risk   9.6   7.1  3 X Average Risk  23.4   11.0        Use the calculated Patient Ratio above and the CHD Risk Table to determine the patient's CHD Risk.        ATP III CLASSIFICATION (LDL):  <100     mg/dL   Optimal  829-937  mg/dL   Near or Above                    Optimal  130-159  mg/dL   Borderline  169-678  mg/dL   High  >938     mg/dL   Very High Performed at [facility], [address] [place], [place] [address]     Blood Alcohol level:  Lab Results  Component Value Date   [facility] <10 03/25/2022   ETH <10 10/11/2021    Metabolic Disorder Labs: Lab Results  Component Value Date   HGBA1C 5.0 03/28/2022   MPG 96.8 03/28/2022   No results found for: "PROLACTIN" Lab Results  Component Value Date   CHOL 116 03/28/2022   TRIG 76 03/28/2022   HDL 44 03/28/2022   CHOLHDL 2.6 03/28/2022   VLDL 15 03/28/2022   LDLCALC 57 03/28/2022  Physical Findings: AIMS:  , ,  ,  ,    CIWA:    COWS:     Musculoskeletal: Strength & Muscle Tone: within normal limits Gait & Station: normal Patient leans: N/A  Psychiatric Specialty Exam:  Presentation  General Appearance: Bizarre; Disheveled  Eye Contact:Absent  Speech:Garbled  Speech Volume:Decreased  Handedness:Left   Mood and Affect  Mood:Dysphoric  Affect:Blunt; Inappropriate   Thought  Process  Thought Processes:Disorganized; Irrevelant  Descriptions of Associations:Loose  Orientation:-- (UTA)  Thought Content:Illogical; Delusions  History of Schizophrenia/Schizoaffective disorder:Yes  Duration of Psychotic Symptoms:Greater than six months  Hallucinations:No data recorded Ideas of Reference:Delusions; Paranoia  Suicidal Thoughts:No data recorded Homicidal Thoughts:No data recorded  Sensorium  Memory:-- (WILL NOT RESPOND TO QUESTIONS)  Judgment:Impaired  Insight:None   Executive Functions  Concentration:Poor  Attention Span:Poor  Recall:Poor  Fund of Knowledge:Poor  Language:Poor   Psychomotor Activity  Psychomotor Activity:No data recorded  Assets  Assets:Financial Resources/Insurance   Sleep  Sleep:No data recorded   Physical Exam: Physical Exam Vitals and nursing note reviewed.  Constitutional:      Appearance: Normal appearance.  HENT:     Head: Normocephalic and atraumatic.     Mouth/Throat:     Pharynx: Oropharynx is clear.  Eyes:     Pupils: Pupils are equal, round, and reactive to light.  Cardiovascular:     Rate and Rhythm: Normal rate and regular rhythm.  Pulmonary:     Effort: Pulmonary effort is normal.     Breath sounds: Normal breath sounds.  Abdominal:     General: Abdomen is flat.     Palpations: Abdomen is soft.  Musculoskeletal:        General: Normal range of motion.  Skin:    General: Skin is warm and dry.  Neurological:     General: No focal deficit present.     Mental Status: He is alert. Mental status is at baseline.  Psychiatric:        Attention and Perception: He is inattentive.        Mood and Affect: Affect is blunt and inappropriate.        Speech: He is noncommunicative.        Behavior: Behavior is withdrawn.    Review of Systems  Unable to perform ROS: Psychiatric disorder   Blood pressure 117/75, pulse (!) 102, temperature 98.8 F (37.1 C), temperature source Oral, resp. rate 18,  height 5\' 7"  (1.702 m), weight 65.8 kg, SpO2 100 %. Body mass index is 22.72 kg/m.   Treatment Plan Summary: Medication management and Plan no change to medication management continue with haloperidol.  15-minute checks.  Daily observation.  No new labs to be ordered.  Reviewed labs with patient including the fact that he is rather anemic probably from poor diet.  , MD 03/28/2022, 3:05 PM

## 2022-03-28 NOTE — Group Note (Signed)
[facility] LCSW Group Therapy Note   Group Date: 03/28/2022 Start Time: 1300 End Time: 1400  Type of Therapy and Topic:  Group Therapy:  Feelings around Relapse and Recovery  Participation Level:  None    Description of Group:    Patients in this group will discuss emotions they experience before and after a relapse. They will process how experiencing these feelings, or avoidance of experiencing them, relates to having a relapse. Facilitator will guide patients to explore emotions they have related to recovery. Patients will be encouraged to process which emotions are more powerful. They will be guided to discuss the emotional reaction significant others in their lives may have to patients' relapse or recovery. Patients will be assisted in exploring ways to respond to the emotions of others without this contributing to a relapse.  Therapeutic Goals: Patient will identify two or more emotions that lead to relapse for them:  Patient will identify two emotions that result when they relapse:  Patient will identify two emotions related to recovery:  Patient will demonstrate ability to communicate their needs through discussion and/or role plays.   Summary of Patient Progress: Patient walked into the group room and stood in the frame of the door for a couple of minutes, looking at the closet door, then he walked out and did not come back.   Therapeutic Modalities:   Cognitive Behavioral Therapy Solution-Focused Therapy Assertiveness Training Relapse Prevention Therapy   Glenis Smoker, LCSW

## 2022-03-28 NOTE — Progress Notes (Signed)
Patient presents with bizarre affect, mumbled speech. Patient observed interacting appropriately with staff and peers on the unit. Pt didn't have any medication scheduled tonight and hasn't requested anything PRN. Pt given education, support, and encouragement to be active in his treatment plan. Pt being monitored Q 15 minutes for safety per unit protocol, remains safe on the unit.

## 2022-03-29 DIAGNOSIS — F203 Undifferentiated schizophrenia: Secondary | ICD-10-CM | POA: Diagnosis not present

## 2022-03-29 MED ORDER — HALOPERIDOL LACTATE 5 MG/ML IJ SOLN: 2.0000 mg | Freq: Two times a day (BID) | INTRAMUSCULAR | Status: DC

## 2022-03-29 MED ORDER — HALOPERIDOL 1 MG PO TABS
4.0000 mg | ORAL_TABLET | Freq: Two times a day (BID) | ORAL | Status: DC
  Administered 2022-03-29 – 2022-04-02 (×8): 4 mg via ORAL
  Filled 2022-03-29 (×8): qty 4

## 2022-03-29 MED ORDER — FERROUS SULFATE 325 (65 FE) MG PO TABS
325.0000 mg | ORAL_TABLET | Freq: Every day | ORAL | Status: DC
  Administered 2022-03-29 – 2022-04-10 (×13): 325 mg via ORAL
  Filled 2022-03-29 (×13): qty 1

## 2022-03-29 NOTE — [facility] Group Notes (Signed)
Pt did not attend group. 

## 2022-03-29 NOTE — Plan of Care (Signed)
Pt denies HI SI AVH, pacing frequently up and down hall.  Pt asked for milk or ensure to help his stomach and received ensure with no further complaints.  Safety was maintained through night, Pt was observed via q90m checks.

## 2022-03-29 NOTE — [facility] Group Notes (Signed)
Pt did not attend psycho-ed group. 

## 2022-03-29 NOTE — Plan of Care (Addendum)
D- Patient alert and oriented to person. Patient presented in a suspicious/preoccupied, but pleasant mood on assessment. Patient stays in the dayroom majority of the day, however, he does walk back and forth to his room. Patient still has not talked to this Clinical research associate, anytime this Clinical research associate goes up to patient to try and assess him, he just looks at this Clinical research associate and mumbles incoherently. Patient did not allow the night staff to obtain a set of vitals on him this morning. This writer is unable to assess if patient is experiencing SI, HI, AVH, and pain, however, patient does not appear to be in any distress nor pain. Patient has also not voiced if he is experiencing any signs/symptoms of depression/anxiety. Patient had no stated goals for today.  A- Scheduled medications administered to patient, per MD orders. Support and encouragement provided.  Routine safety checks conducted every 15 minutes.  Patient informed to notify staff with problems or concerns.  R- No adverse drug reactions noted. Patient contracts for safety at this time. Patient compliant with medications after staring at the cup for a while, and staff encouragement. Patient remains safe at this time.  Problem: Education: Goal: Knowledge of General Education information will improve Description: Including pain rating scale, medication(s)/side effects and non-pharmacologic comfort measures Outcome: Not Progressing   Problem: Health Behavior/Discharge Planning: Goal: Ability to manage health-related needs will improve Outcome: Not Progressing   Problem: Clinical Measurements: Goal: Ability to maintain clinical measurements within normal limits will improve Outcome: Not Progressing Goal: Will remain free from infection Outcome: Not Progressing Goal: Diagnostic test results will improve Outcome: Not Progressing Goal: Respiratory complications will improve Outcome: Not Progressing Goal: Cardiovascular complication will be avoided Outcome: Not  Progressing   Problem: Activity: Goal: Risk for activity intolerance will decrease Outcome: Not Progressing   Problem: Nutrition: Goal: Adequate nutrition will be maintained Outcome: Not Progressing   Problem: Coping: Goal: Level of anxiety will decrease Outcome: Not Progressing   Problem: Elimination: Goal: Will not experience complications related to bowel motility Outcome: Not Progressing Goal: Will not experience complications related to urinary retention Outcome: Not Progressing   Problem: Pain Managment: Goal: General experience of comfort will improve Outcome: Not Progressing   Problem: Safety: Goal: Ability to remain free from injury will improve Outcome: Not Progressing   Problem: Skin Integrity: Goal: Risk for impaired skin integrity will decrease Outcome: Not Progressing

## 2022-03-29 NOTE — Progress Notes (Signed)
This Clinical research associate was changing out the coffee machine, and patient came up to me and stated "ma'am, can I get some cream and sugar". This has been the first clear statement that patient has said to this writer in two days.

## 2022-03-29 NOTE — Progress Notes (Signed)
Patient was upset that Vernona Rieger, MHT asked him to clean up his trash, from previous meals, before receiving his dinner tray. Patient then started yelling incoherently and pointing his fingers in Laura's face. This Clinical research associate, along with other staff attempted to redirect patient and explain to him that he is to clean up after himself. Patient then starts to try and grab a tray from the cart. Staff stated to patient that he could not touch the carts or trays. Patient walked off and went back to the table that he was sitting at. Patient was given his scheduled medication, along with PRN medication for agitation. Patient tolerated medication administration well, without any issues, or a fight. After patient received his Geodon injection, he mumbled something and then said "attempted murder". Patient remains safe on the unit and will continue to be monitored.

## 2022-03-29 NOTE — Progress Notes (Signed)
[facility] MD Progress Note  03/29/2022 12:42 PM Danny Blackwell  MRN:  [id number] Subjective: Follow-up patient with schizophrenia.  Went to see him this morning and he was asleep but after he got up I came to see him and approached him to talk.  Patient literally ignored me walking straight past despite my addressing him several times continuing to mutter to himself.  Not aggressive or violent mostly stays to himself seems to be very disorganized in his thinking.  So far at least appears to be cooperative with medicine.  Reviewing labs the most remarkable thing is that he is anemic Principal Problem: Schizophrenia (HCC) Diagnosis: Principal Problem:   Schizophrenia (HCC) Active Problems:   Psychosis (HCC)  Total Time spent with patient: 20 minutes  Past Psychiatric History: Past history unknown to me directly but everything about him suggest schizophrenia  Past Medical History: History reviewed. No pertinent past medical history. History reviewed. No pertinent surgical history. Family History: History reviewed. No pertinent family history. Family Psychiatric  History: Unknown Social History:  Social History   Substance and Sexual Activity  Alcohol Use None     Social History   Substance and Sexual Activity  Drug Use Not on file    Social History   Socioeconomic History   Marital status: Unknown    Spouse name: Not on file   Number of children: Not on file   Years of education: Not on file   Highest education level: Not on file  Occupational History   Not on file  Tobacco Use   Smoking status: Every Day    Packs/day: 1.00    Years: 15.00    Total pack years: 15.00    Types: Cigarettes   Smokeless tobacco: Never  Vaping Use   Vaping Use: Unknown  Substance and Sexual Activity   Alcohol use: Not on file   Drug use: Not on file   Sexual activity: Not on file  Other Topics Concern   Not on file  Social History Narrative   ** Merged History Encounter **       Social  Determinants of Health   Financial Resource Strain: Not on file  Food Insecurity: Not on file  Transportation Needs: Not on file  Physical Activity: Not on file  Stress: Not on file  Social Connections: Not on file   Additional Social History:                         Sleep: Fair  Appetite:  Fair  Current Medications: Current Facility-Administered Medications  Medication Dose Route Frequency Provider Last Rate Last Admin   acetaminophen (TYLENOL) tablet 650 mg  650 mg Oral Q6H PRN Vanetta Mulders, NP       alum & mag hydroxide-simeth (MAALOX/MYLANTA) 200-200-20 MG/5ML suspension 30 mL  30 mL Oral Q4H PRN Gabriel Cirri F, NP       diphenhydrAMINE (BENADRYL) injection 50 mg  50 mg Intravenous Q6H PRN Marvel Sapp T, MD       feeding supplement (ENSURE ENLIVE / ENSURE PLUS) liquid 237 mL  237 mL Oral BID BM Sricharan Lacomb T, MD   237 mL at 03/29/22 1017   haloperidol (HALDOL) tablet 4 mg  4 mg Oral BID Carmilla Granville T, MD       Or   haloperidol lactate (HALDOL) injection 2 mg  2 mg Intramuscular BID Mihailo Sage, Jackquline Denmark, MD       hydrOXYzine (ATARAX) tablet 25 mg  25 mg Oral TID PRN Vanetta Mulders, NP   25 mg at 03/27/22 1542   LORazepam (ATIVAN) tablet 2 mg  2 mg Oral Q6H PRN Torrance Frech, Jackquline Denmark, MD   2 mg at 03/27/22 1657   Or   LORazepam (ATIVAN) injection 2 mg  2 mg Intramuscular Q6H PRN Tavone Caesar, Jackquline Denmark, MD       magnesium hydroxide (MILK OF MAGNESIA) suspension 30 mL  30 mL Oral Daily PRN Vanetta Mulders, NP       ziprasidone (GEODON) injection 20 mg  20 mg Intramuscular Q12H PRN Coraleigh Sheeran, Jackquline Denmark, MD        Lab Results:  Results for orders placed or performed during the hospital encounter of 03/26/22 (from the past 48 hour(s))  Hemoglobin A1c     Status: None   Collection Time: 03/28/22  6:36 AM  Result Value Ref Range   Hgb A1c MFr Bld 5.0 4.8 - 5.6 %    Comment: (NOTE) Pre diabetes:          5.7%-6.4%  Diabetes:              >6.4%  Glycemic control for    <7.0% adults with diabetes    Mean Plasma Glucose 96.8 mg/dL    Comment: Performed at [facility], [address] [place], [place] [address]  Lipid panel     Status: None   Collection Time: 03/28/22  6:36 AM  Result Value Ref Range   Cholesterol 116 0 - 200 mg/dL   Triglycerides 76 <604 mg/dL   HDL 44 >54 mg/dL   Total CHOL/HDL Ratio 2.6 RATIO   VLDL 15 0 - 40 mg/dL   LDL Cholesterol 57 0 - 99 mg/dL    Comment:        Total Cholesterol/HDL:CHD Risk Coronary Heart Disease Risk Table                     Men   Women  1/2 Average Risk   3.4   3.3  Average Risk       5.0   4.4  2 X Average Risk   9.6   7.1  3 X Average Risk  23.4   11.0        Use the calculated Patient Ratio above and the CHD Risk Table to determine the patient's CHD Risk.        ATP III CLASSIFICATION (LDL):  <100     mg/dL   Optimal  098-119  mg/dL   Near or Above                    Optimal  130-159  mg/dL   Borderline  147-829  mg/dL   High  >562     mg/dL   Very High Performed at [facility], [address] [place], [place] [address]     Blood Alcohol level:  Lab Results  Component Value Date   [facility] <10 03/25/2022   ETH <10 10/11/2021    Metabolic Disorder Labs: Lab Results  Component Value Date   HGBA1C 5.0 03/28/2022   MPG 96.8 03/28/2022   No results found for: "PROLACTIN" Lab Results  Component Value Date   CHOL 116 03/28/2022   TRIG 76 03/28/2022   HDL 44 03/28/2022   CHOLHDL 2.6 03/28/2022   VLDL 15 03/28/2022   LDLCALC 57 03/28/2022    Physical Findings: AIMS:  , ,  ,  ,  CIWA:    COWS:     Musculoskeletal: Strength & Muscle Tone: within normal limits Gait & Station: normal Patient leans: N/A  Psychiatric Specialty Exam:  Presentation  General Appearance: Bizarre; Disheveled  Eye Contact:Absent  Speech:Garbled  Speech Volume:Decreased  Handedness:Left   Mood and Affect  Mood:Dysphoric  Affect:Blunt; Inappropriate   Thought Process   Thought Processes:Disorganized; Irrevelant  Descriptions of Associations:Loose  Orientation:-- (UTA)  Thought Content:Illogical; Delusions  History of Schizophrenia/Schizoaffective disorder:Yes  Duration of Psychotic Symptoms:Greater than six months  Hallucinations:No data recorded Ideas of Reference:Delusions; Paranoia  Suicidal Thoughts:No data recorded Homicidal Thoughts:No data recorded  Sensorium  Memory:-- (WILL NOT RESPOND TO QUESTIONS)  Judgment:Impaired  Insight:None   Executive Functions  Concentration:Poor  Attention Span:Poor  Recall:Poor  Fund of Knowledge:Poor  Language:Poor   Psychomotor Activity  Psychomotor Activity:No data recorded  Assets  Assets:Financial Resources/Insurance   Sleep  Sleep:No data recorded   Physical Exam: Physical Exam Vitals and nursing note reviewed.  Constitutional:      Appearance: Normal appearance.  HENT:     Head: Normocephalic and atraumatic.     Mouth/Throat:     Pharynx: Oropharynx is clear.  Eyes:     Pupils: Pupils are equal, round, and reactive to light.  Cardiovascular:     Rate and Rhythm: Normal rate and regular rhythm.  Pulmonary:     Effort: Pulmonary effort is normal.     Breath sounds: Normal breath sounds.  Abdominal:     General: Abdomen is flat.     Palpations: Abdomen is soft.  Musculoskeletal:        General: Normal range of motion.  Skin:    General: Skin is warm and dry.  Neurological:     General: No focal deficit present.     Mental Status: He is alert. Mental status is at baseline.  Psychiatric:        Attention and Perception: He is inattentive.        Mood and Affect: Affect is blunt and inappropriate.        Speech: He is noncommunicative.    Review of Systems  Unable to perform ROS: Psychiatric disorder   Blood pressure 99/67, pulse (!) 102, temperature 99 F (37.2 C), temperature source Oral, resp. rate 16, height 5\' 7"  (1.702 m), weight 65.8 kg, SpO2 100 %.  Body mass index is 22.72 kg/m.   Treatment Plan Summary: Medication management and Plan patient appears no different than he did last time I talked to him.  Very disorganized and unable to hold a lucid conversation.  Not aggressive but we really cannot form any kind of treatment plan for discharge with him in this mental state.  Increase dose of Haldol to 4 mg twice a day.  I am perfectly fine with him getting extra portions as he appears to probably be chronically under nourished.  I also added an iron supplement.  , MD 03/29/2022, 12:42 PM

## 2022-03-30 DIAGNOSIS — F203 Undifferentiated schizophrenia: Secondary | ICD-10-CM | POA: Diagnosis not present

## 2022-03-30 MED ORDER — CALCIUM CARBONATE ANTACID 500 MG PO CHEW
1.0000 | CHEWABLE_TABLET | Freq: Two times a day (BID) | ORAL | Status: DC
  Administered 2022-03-31 (×2): 200 mg via ORAL
  Filled 2022-03-30 (×4): qty 1

## 2022-03-30 MED ORDER — PANTOPRAZOLE SODIUM 40 MG PO TBEC
40.0000 mg | DELAYED_RELEASE_TABLET | Freq: Every day | ORAL | Status: DC
  Administered 2022-03-30 – 2022-04-02 (×4): 40 mg via ORAL
  Filled 2022-03-30 (×4): qty 1

## 2022-03-30 MED ORDER — LOPERAMIDE HCL 2 MG PO CAPS
2.0000 mg | ORAL_CAPSULE | ORAL | Status: DC | PRN
Start: 2022-03-30 — End: 2022-04-10

## 2022-03-30 NOTE — Progress Notes (Signed)
[facility] MD Progress Note  03/30/2022 12:20 PM Danny Blackwell  MRN:  [id number] Subjective: Follow-up for this patient with schizophrenia.  Continues to be noncommunicative to most of this.  Will not speak to me today.  Communicated with nurse but just barely made any sense.  Not acting aggressively although will get agitated at times.  Appears to be basically compliant with medicine.  The only new development I am told is that he was complaining of stomach discomfort.  We will provide any other information to clarify it but I do know that he has been anemic and it certainly possible he could have an ulcer with his poor care so I will add pantoprazole Principal Problem: Schizophrenia ([place]) Diagnosis: Principal Problem:   Schizophrenia ([place]) Active Problems:   Psychosis ([place])  Total Time spent with patient: 30 minutes  Past Psychiatric History: Past history of schizophrenia  Past Medical History: History reviewed. No pertinent past medical history. History reviewed. No pertinent surgical history. Family History: History reviewed. No pertinent family history. Family Psychiatric  History: See previous Social History:  Social History   Substance and Sexual Activity  Alcohol Use None     Social History   Substance and Sexual Activity  Drug Use Not on file    Social History   Socioeconomic History   Marital status: Unknown    Spouse name: Not on file   Number of children: Not on file   Years of education: Not on file   Highest education level: Not on file  Occupational History   Not on file  Tobacco Use   Smoking status: Every Day    Packs/day: 1.00    Years: 15.00    Total pack years: 15.00    Types: Cigarettes   Smokeless tobacco: Never  Vaping Use   Vaping Use: Unknown  Substance and Sexual Activity   Alcohol use: Not on file   Drug use: Not on file   Sexual activity: Not on file  Other Topics Concern   Not on file  Social History Narrative   ** Merged History  Encounter **       Social Determinants of Health   Financial Resource Strain: Not on file  Food Insecurity: Not on file  Transportation Needs: Not on file  Physical Activity: Not on file  Stress: Not on file  Social Connections: Not on file   Additional Social History:                         Sleep: Fair  Appetite:  Fair  Current Medications: Current Facility-Administered Medications  Medication Dose Route Frequency Provider Last Rate Last Admin   acetaminophen (TYLENOL) tablet 650 mg  650 mg Oral Q6H PRN Sherlon Handing, NP       alum & mag hydroxide-simeth (MAALOX/MYLANTA) 200-200-20 MG/5ML suspension 30 mL  30 mL Oral Q4H PRN Sherlon Handing, NP       calcium carbonate (TUMS - dosed in mg elemental calcium) chewable tablet 200 mg of elemental calcium  1 tablet Oral BID WC Surah Pelley T, MD       diphenhydrAMINE (BENADRYL) injection 50 mg  50 mg Intravenous Q6H PRN Jaylenn Baiza T, MD       feeding supplement (ENSURE ENLIVE / ENSURE PLUS) liquid 237 mL  237 mL Oral BID BM Jayzen Paver T, MD   237 mL at 03/30/22 0948   ferrous sulfate tablet 325 mg  325 mg Oral Q  breakfast Donald Jacque, Jackquline Denmark, MD   325 mg at 03/30/22 0854   haloperidol (HALDOL) tablet 4 mg  4 mg Oral BID Jery Hollern, Jackquline Denmark, MD   4 mg at 03/30/22 0962   Or   haloperidol lactate (HALDOL) injection 2 mg  2 mg Intramuscular BID Suleiman Finigan, Jackquline Denmark, MD       hydrOXYzine (ATARAX) tablet 25 mg  25 mg Oral TID PRN Vanetta Mulders, NP   25 mg at 03/27/22 1542   loperamide (IMODIUM) capsule 2 mg  2 mg Oral PRN Gayanne Prescott, Jackquline Denmark, MD       LORazepam (ATIVAN) tablet 2 mg  2 mg Oral Q6H PRN Ying [place]ocks, Jackquline Denmark, MD   2 mg at 03/27/22 1657   Or   LORazepam (ATIVAN) injection 2 mg  2 mg Intramuscular Q6H PRN Luz Mares, Jackquline Denmark, MD       magnesium hydroxide (MILK OF MAGNESIA) suspension 30 mL  30 mL Oral Daily PRN Gabriel Cirri F, NP       pantoprazole (PROTONIX) EC tablet 40 mg  40 mg Oral Daily Nyrah Demos T, MD        ziprasidone (GEODON) injection 20 mg  20 mg Intramuscular Q12H PRN Maralyn Witherell, Jackquline Denmark, MD   20 mg at 03/29/22 1649    Lab Results: No results found for this or any previous visit (from the past 48 hour(s)).  Blood Alcohol level:  Lab Results  Component Value Date   ETH <10 03/25/2022   ETH <10 10/11/2021    Metabolic Disorder Labs: Lab Results  Component Value Date   HGBA1C 5.0 03/28/2022   MPG 96.8 03/28/2022   No results found for: "PROLACTIN" Lab Results  Component Value Date   CHOL 116 03/28/2022   TRIG 76 03/28/2022   HDL 44 03/28/2022   CHOLHDL 2.6 03/28/2022   VLDL 15 03/28/2022   LDLCALC 57 03/28/2022    Physical Findings: AIMS: Facial and Oral Movements Muscles of Facial Expression: None, normal Lips and Perioral Area: None, normal Jaw: None, normal Tongue: None, normal,Extremity Movements Upper (arms, wrists, hands, fingers): None, normal Lower (legs, knees, ankles, toes): None, normal, Trunk Movements Neck, shoulders, hips: None, normal, Overall Severity Severity of abnormal movements (highest score from questions above): None, normal Incapacitation due to abnormal movements: None, normal Patient's awareness of abnormal movements (rate only patient's report): No Awareness, Dental Status Current problems with teeth and/or dentures?: No Does patient usually wear dentures?: No  CIWA:    COWS:     Musculoskeletal: Strength & Muscle Tone: within normal limits Gait & Station: normal Patient leans: N/A  Psychiatric Specialty Exam:  Presentation  General Appearance: Bizarre; Disheveled  Eye Contact:Absent  Speech:Garbled  Speech Volume:Decreased  Handedness:Left   Mood and Affect  Mood:Dysphoric  Affect:Blunt; Inappropriate   Thought Process  Thought Processes:Disorganized; Irrevelant  Descriptions of Associations:Loose  Orientation:-- (UTA)  Thought Content:Illogical; Delusions  History of Schizophrenia/Schizoaffective  disorder:Yes  Duration of Psychotic Symptoms:Greater than six months  Hallucinations:No data recorded Ideas of Reference:Delusions; Paranoia  Suicidal Thoughts:No data recorded Homicidal Thoughts:No data recorded  Sensorium  Memory:-- (WILL NOT RESPOND TO QUESTIONS)  Judgment:Impaired  Insight:None   Executive Functions  Concentration:Poor  Attention Span:Poor  Recall:Poor  Fund of Knowledge:Poor  Language:Poor   Psychomotor Activity  Psychomotor Activity:No data recorded  Assets  Assets:Financial Resources/Insurance   Sleep  Sleep:No data recorded   Physical Exam: Physical Exam Vitals and nursing note reviewed.  Constitutional:      Appearance: Normal appearance.  HENT:     Head: Normocephalic and atraumatic.     Mouth/Throat:     Pharynx: Oropharynx is clear.  Eyes:     Pupils: Pupils are equal, round, and reactive to light.  Cardiovascular:     Rate and Rhythm: Normal rate and regular rhythm.  Pulmonary:     Effort: Pulmonary effort is normal.     Breath sounds: Normal breath sounds.  Abdominal:     General: Abdomen is flat.     Palpations: Abdomen is soft.  Musculoskeletal:        General: Normal range of motion.  Skin:    General: Skin is warm and dry.  Neurological:     General: No focal deficit present.     Mental Status: He is alert. Mental status is at baseline.  Psychiatric:        Attention and Perception: He is inattentive.        Mood and Affect: Affect is blunt.        Speech: He is noncommunicative.    Review of Systems  Gastrointestinal:  Positive for abdominal pain.   Blood pressure 105/68, pulse 86, temperature 99 F (37.2 C), temperature source Oral, resp. rate 16, height 5\' 7"  (1.702 m), weight 65.8 kg, SpO2 100 %. Body mass index is 22.72 kg/m.   Treatment Plan Summary: Medication management and Plan no change to antipsychotic medicine.  Added as needed medicine Imodium as well as standing pantoprazole.  Encourage  patient to communicate with .  Ongoing daily assessment of behavior.  Probably worth rechecking his blood counts tomorrow to make sure he is not losing more blood  [place], MD 03/30/2022, 12:20 PM

## 2022-03-30 NOTE — [facility] Group Notes (Signed)
[facility] Group Notes:  (Nursing/MHT/Case Management/Adjunct)  Date:  03/30/2022  Time:  9:39 PM  Type of Therapy:   Wrap up  Participation Level:  Did Not Attend Summary of Progress/Problems:  Mayra Neer 03/30/2022, 9:39 PM

## 2022-03-30 NOTE — Progress Notes (Signed)
Unable to assess pt for SI/HI/AVH. Pt was mumbling under breath, rapidly. Unable to comprhend what pt is saying. Pt would not look and or acknowledge RN. Pt had to inform pt that he would have to get an injection if he did not take his medication PO. After such, pt point at RN and started to mumble rapidly then grabbed the medication. Pt went to grab water and RN pushed the water closer to him. Pt seemed paranoid as he then backed his hand away and drank with his drink. Pt then got up and walked to his room. Pt refused labs. Pt is preoccupied with upset stomach. Pt did take Protonix but won't utilize the tums or loperamide. Pt seems to not be able to sit still after eating. Scheduled medications administered to pt, per MD orders. RN provided support and encouragement to pt. Q15 min safety checks implemented and continued. Pt safe on the unit. RN will continue to monitor and intervene as needed.   03/30/22 0854  Psych Admission Type (Psych Patients Only)  Admission Status Involuntary  Psychosocial Assessment  Patient Complaints None  Eye Contact None;Poor;Watchful  Facial Expression Flat;Blank  Affect Preoccupied;Fearful  Speech Soft;Rapid;Incoherent  Interaction Guarded;Avoidant;No initiation  Motor Activity Slow  Appearance/Hygiene Poor hygiene;Disheveled  Behavior Characteristics Unwilling to participate;Resistant to care;Guarded  Mood Preoccupied;Suspicious  Thought Process  Coherency Disorganized;Incoherent  Content UTA  Delusions Paranoid  Perception UTA  Hallucination UTA  Judgment Poor  Confusion Mild  Danger to Self  Current suicidal ideation? Denies (unable to understand)  Danger to Others  Danger to Others None reported or observed

## 2022-03-30 NOTE — [facility] Group Notes (Signed)
LCSW Wellness Group Note   03/30/2022 1:00pm  Type of Group and Topic: Psychoeducational Group:  Wellness  Participation Level:  did not attend  Description of Group  Wellness group introduces the topic and its focus on developing healthy habits across the spectrum and its relationship to a decrease in hospital admissions.  Six areas of wellness are discussed: physical, social spiritual, intellectual, occupational, and emotional.  Patients are asked to consider their current wellness habits and to identify areas of wellness where they are interested and able to focus on improvements.    Therapeutic Goals Patients will understand components of wellness and how they can positively impact overall health.  Patients will identify areas of wellness where they have developed good habits. Patients will identify areas of wellness where they would like to make improvements.    Summary of Patient Progress     Therapeutic Modalities: Cognitive Behavioral Therapy Psychoeducation    Eddrick Dilone Jon, LCSW  

## 2022-03-30 NOTE — Progress Notes (Signed)
   03/30/22 2300  Psych Admission Type (Psych Patients Only)  Admission Status Involuntary  Psychosocial Assessment  Patient Complaints None  Eye Contact None;Poor;Watchful  Facial Expression Blank;Flat  Affect Preoccupied;Fearful  Speech Soft;Elective mutism  Interaction Avoidant;Guarded;No initiation  Motor Activity Slow  Appearance/Hygiene Poor hygiene;Disheveled  Behavior Characteristics Unwilling to participate;Guarded  Mood Suspicious;Preoccupied  Thought Process  Coherency Disorganized;Incoherent  Content UTA  Delusions Paranoid  Perception UTA  Hallucination UTA  Judgment Poor  Confusion Mild  Danger to Self  Current suicidal ideation? Denies  Danger to Others  Danger to Others None reported or observed

## 2022-03-30 NOTE — [facility] Counselor (Signed)
Second attempt to complete PSA.  CSW attempted to speak with pt for several minutes, pt talking into his pillow, unable to understand.  After several questions, pt began getting agitated, several curse words, CSW did not pursue further.  Daleen Squibb, MSW, LCSW 8/27/202311:00 AM

## 2022-03-31 DIAGNOSIS — F200 Paranoid schizophrenia: Secondary | ICD-10-CM

## 2022-03-31 MED ORDER — SUCRALFATE 1 G PO TABS
1.0000 g | ORAL_TABLET | Freq: Three times a day (TID) | ORAL | Status: DC
  Administered 2022-04-01 – 2022-04-10 (×34): 1 g via ORAL
  Filled 2022-03-31 (×41): qty 1

## 2022-03-31 MED ORDER — DIPHENHYDRAMINE HCL 50 MG/ML IJ SOLN
50.0000 mg | Freq: Once | INTRAMUSCULAR | Status: AC
Start: 2022-03-31 — End: 2022-03-31
  Administered 2022-03-31: 50 mg via INTRAMUSCULAR

## 2022-03-31 MED ORDER — CLONAZEPAM 0.5 MG PO TABS
0.5000 mg | ORAL_TABLET | Freq: Two times a day (BID) | ORAL | Status: DC
  Administered 2022-03-31 – 2022-04-10 (×20): 0.5 mg via ORAL
  Filled 2022-03-31 (×20): qty 1

## 2022-03-31 NOTE — Progress Notes (Addendum)
Recreation Therapy Notes    Date: 03/31/2022   Time: 10:50 am     Location: Craft room    Behavioral response: N/A   Intervention Topic: Relaxation    Discussion/Intervention: Patient refused to attend group.    Clinical Observations/Feedback:  Patient refused to attend group.    Gareld Obrecht LRT/CTRS        Proctor Carriker 03/31/2022 12:31 PM

## 2022-03-31 NOTE — Group Note (Signed)
[facility] LCSW Group Therapy Note    Group Date: 03/31/2022 Start Time: 1300 End Time: 1400  Type of Therapy and Topic:  Group Therapy:  Overcoming Obstacles  Participation Level:  [facility] PARTICIPATION LEVEL: Did Not Attend   Description of Group:   In this group patients will be encouraged to explore what they see as obstacles to their own wellness and recovery. They will be guided to discuss their thoughts, feelings, and behaviors related to these obstacles. The group will process together ways to cope with barriers, with attention given to specific choices patients can make. Each patient will be challenged to identify changes they are motivated to make in order to overcome their obstacles. This group will be process-oriented, with patients participating in exploration of their own experiences as well as giving and receiving support and challenge from other group members.  Therapeutic Goals: 1. Patient will identify personal and current obstacles as they relate to admission. 2. Patient will identify barriers that currently interfere with their wellness or overcoming obstacles.  3. Patient will identify feelings, thought process and behaviors related to these barriers. 4. Patient will identify two changes they are willing to make to overcome these obstacles:    Summary of Patient Progress Group not held due to acuity.   Therapeutic Modalities:   Cognitive Behavioral Therapy Solution Focused Therapy Motivational Interviewing Relapse Prevention Therapy   Hlee Fringer R Ilana Prezioso, LCSW 

## 2022-03-31 NOTE — [facility] Group Notes (Signed)
[facility] Group Notes:  (Nursing/MHT/Case Management/Adjunct)  Date:  03/31/2022  Time:  10:24 AM  Type of Therapy:   Community Meeting  Participation Level:  Did Not Attend   Lynelle Smoke [facility] 03/31/2022, 10:24 AM

## 2022-03-31 NOTE — Progress Notes (Signed)
Patient presents with bizarre affect, mumbled speech. Patient observed interacting appropriately with staff and peers on the unit. Pt refused his night medication, given education, still refused. Pt given education, support, and encouragement to be active in his treatment plan. Pt being monitored Q 15 minutes for safety per unit protocol, remains safe on the unit.

## 2022-03-31 NOTE — [facility] Counselor (Signed)
Adult Comprehensive Assessment  Patient ID: Danny Blackwell, male   DOB: [birth year], 51 y.o.   MRN: [id number]  Information Source: Information source:  (Chart review, pt unable to participate in assessment process due to altered mental status and resistance to engaging.)  Current Stressors:  Patient states their primary concerns and needs for treatment are:: [facility] severe paranoia. Patient states their goals for this hospitilization and ongoing recovery are:: Unable to assess Educational / Learning stressors: Unable to assess Employment / Job issues: Unable to assess Family Relationships: Unable to Counsellor / Lack of resources (include bankruptcy): Unable to assess Housing / Lack of housing: Unable to assess Physical health (include injuries & life threatening diseases): Unable to assess Social relationships: Unable to assess Substance abuse: Unable to assess Bereavement / Loss: Unable to assess  Living/Environment/Situation:  Living Arrangements: Other (Comment) (Unable to assess) Living conditions (as described by patient or guardian): Unable to assess Who else lives in the home?: Unable to assess How long has patient lived in current situation?: Unable to assess What is atmosphere in current home: Other (Comment) (Unable to assess)  Family History:  Marital status: Other (comment) (Unable to assess) Are you sexually active?:  (Unable to assess) What is your sexual orientation?: Unable to assess Has your sexual activity been affected by drugs, alcohol, medication, or emotional stress?: Unable to assess Does patient have children?:  (Unable to assess)  Childhood History:  By whom was/is the patient raised?: Other (Comment) (Unable to assess) Additional childhood history information: Unable to assess Description of patient's relationship with caregiver when they were a child: Unable to assess Patient's description of current  relationship with people who raised him/her: Unable to assess How were you disciplined when you got in trouble as a child/adolescent?: Unable to assess Does patient have siblings?:  (Unable to assess) Did patient suffer any verbal/emotional/physical/sexual abuse as a child?:  (Unable to assess) Did patient suffer from severe childhood neglect?:  (Unable to assess) Has patient ever been sexually abused/assaulted/raped as an adolescent or adult?:  (Unable to assess) Was the patient ever a victim of a crime or a disaster?:  (Unable to assess) Witnessed domestic violence?:  (Unable to assess) Has patient been affected by domestic violence as an adult?:  (Unable to assess)  Education:  Highest grade of school patient has completed: Unable to assess Currently a student?:  (Unable to assess) Learning disability?:  (Unable to assess)  Employment/Work Situation:   Employment Situation:  (Unable to assess) Patient's Job has Been Impacted by Current Illness:  (Unable to assess) What is the Longest Time Patient has Held a Job?: Unable to assess Where was the Patient Employed at that Time?: Unable to assess Has Patient ever Been in the [employer]?:  (Unable to assess)  Financial Resources:   Financial resources: Medicaid (Noted to have medicaid in chart) Does patient have a representative payee or guardian?:  (Unable to assess)  Alcohol/Substance Abuse:   What has been your use of drugs/alcohol within the last 12 months?: Unable to assess. However, UDS is negative for any substances including alcohol. If attempted suicide, did drugs/alcohol play a role in this?:  (Unable to assess) Alcohol/Substance Abuse Treatment Hx:  (Unable to assess) If yes, describe treatment: Unable to assess Has alcohol/substance abuse ever caused legal problems?:  (Unable to assess)  Social Support System:   Patient's Community Support System:  (Unable to assess) Describe Community Support System: Unable to  assess Type  of faith/religion: Unable to assess How does patient's faith help to cope with current illness?: Unable to assess  Leisure/Recreation:   Do You Have Hobbies?:  (Unable to assess due to altered mental status/refusal to engage in process)  Strengths/Needs:   What is the patient's perception of their strengths?: Unable to assess Patient states they can use these personal strengths during their treatment to contribute to their recovery: Unable to assess Patient states these barriers may affect/interfere with their treatment: Unable to assess Patient states these barriers may affect their return to the community: Unable to assess Other important information patient would like considered in planning for their treatment: Unable to assess  Discharge Plan:   Currently receiving community mental health services:  (Unable to assess) Patient states concerns and preferences for aftercare planning are: Unable to assess Patient states they will know when they are safe and ready for discharge when: Unable to assess Does patient have access to transportation?:  (Unable to assess) Does patient have financial barriers related to discharge medications?:  (Unable to assess) Patient description of barriers related to discharge medications: Unable to assess Will patient be returning to same living situation after discharge?:  (Unable to assess)  Summary/Recommendations:   Summary and Recommendations (to be completed by the evaluator): Patient is a 51 year old, male [place] [place] [facility] He was brought to the hospital by police [place] the county jail due to severe paranoia. During his time here, pt has been resistant to engaging with staff or anyone. He paces around, mumbling. When staff attempts to redirect pt he became agitated, cursing, and threatening staff at times. Today, pt became agitated and had to have a medical hold. Per chart, he has [facility]. Pt looks disorganized and his hair is  matted in the back. During previous attempt to meet with pt to complete assessment, CSW called him by name, he looked directly at CSW then turned around and walked away. CSW will need to check with pt regarding continued treatment when he does gain some clarity.  Recommendations include: crisis stabilization, therapeutic milieu, encourage group attendance and participation, medication management for mood stabilization and development of comprehensive mental wellness plan.  Glenis Smoker. 03/31/2022

## 2022-03-31 NOTE — Progress Notes (Signed)
[facility] MD Progress Note  03/31/2022 3:05 PM Danny Blackwell   MRN:  [id number]  Subjective: Follow-up for this patient with schizophrenia.   He is typically non-communicative.  Nurses report he doesn't have any issues cursing besides this he is mumbling unintelligible words at times.  He became very agitated earlier today with yelling and aggression, calmed with agitation medications.  Calm this afternoon with no issues.  He would not allow the lab to get a CBC earlier today to monitor his Hgb, Carafate started to assist with potential gastric ulcer.  Principal Problem: Schizophrenia, paranoid (HCC) Diagnosis: Principal Problem:   Schizophrenia, paranoid (HCC)  Total Time spent with patient: 30 minutes  Past Psychiatric History: Past history of schizophrenia  Past Medical History: History reviewed. No pertinent past medical history. History reviewed. No pertinent surgical history. Family History: History reviewed. No pertinent family history. Family Psychiatric  History: See previous Social History:  Social History   Substance and Sexual Activity  Alcohol Use None     Social History   Substance and Sexual Activity  Drug Use Not on file    Social History   Socioeconomic History   Marital status: Unknown    Spouse name: Not on file   Number of children: Not on file   Years of education: Not on file   Highest education level: Not on file  Occupational History   Not on file  Tobacco Use   Smoking status: Every Day    Packs/day: 1.00    Years: 15.00    Total pack years: 15.00    Types: Cigarettes   Smokeless tobacco: Never  Vaping Use   Vaping Use: Unknown  Substance and Sexual Activity   Alcohol use: Not on file   Drug use: Not on file   Sexual activity: Not on file  Other Topics Concern   Not on file  Social History Narrative   ** Merged History Encounter **       Social Determinants of Health   Financial Resource Strain: Not on file  Food Insecurity: Not on  file  Transportation Needs: Not on file  Physical Activity: Not on file  Stress: Not on file  Social Connections: Not on file   Additional Social History: homeless  Sleep: Fair  Appetite:  Fair  Current Medications: Current Facility-Administered Medications  Medication Dose Route Frequency Provider Last Rate Last Admin   acetaminophen (TYLENOL) tablet 650 mg  650 mg Oral Q6H PRN Vanetta Mulders, NP       alum & mag hydroxide-simeth (MAALOX/MYLANTA) 200-200-20 MG/5ML suspension 30 mL  30 mL Oral Q4H PRN Gabriel Cirri F, NP       calcium carbonate (TUMS - dosed in mg elemental calcium) chewable tablet 200 mg of elemental calcium  1 tablet Oral BID WC Clapacs, John T, MD   200 mg of elemental calcium at 03/31/22 0759   diphenhydrAMINE (BENADRYL) injection 50 mg  50 mg Intravenous Q6H PRN Clapacs, John T, MD       feeding supplement (ENSURE ENLIVE / ENSURE PLUS) liquid 237 mL  237 mL Oral BID BM Clapacs, John T, MD   237 mL at 03/31/22 1034   ferrous sulfate tablet 325 mg  325 mg Oral Q breakfast Clapacs, Jackquline Denmark, MD   325 mg at 03/31/22 0759   haloperidol (HALDOL) tablet 4 mg  4 mg Oral BID Clapacs, Jackquline Denmark, MD   4 mg at 03/31/22 0759   Or   haloperidol lactate (HALDOL)  injection 2 mg  2 mg Intramuscular BID Clapacs, Madie Reno, MD       hydrOXYzine (ATARAX) tablet 25 mg  25 mg Oral TID PRN Sherlon Handing, NP   25 mg at 03/31/22 1106   loperamide (IMODIUM) capsule 2 mg  2 mg Oral PRN Clapacs, Madie Reno, MD       LORazepam (ATIVAN) tablet 2 mg  2 mg Oral Q6H PRN Clapacs, Madie Reno, MD   2 mg at 03/31/22 1106   Or   LORazepam (ATIVAN) injection 2 mg  2 mg Intramuscular Q6H PRN Clapacs, John T, MD       magnesium hydroxide (MILK OF MAGNESIA) suspension 30 mL  30 mL Oral Daily PRN Waldon Merl F, NP       pantoprazole (PROTONIX) EC tablet 40 mg  40 mg Oral Daily Clapacs, John T, MD   40 mg at 03/31/22 0759   ziprasidone (GEODON) injection 20 mg  20 mg Intramuscular Q12H PRN Clapacs, Madie Reno,  MD   20 mg at 03/31/22 1117    Lab Results: No results found for this or any previous visit (from the past 48 hour(s)).  Blood Alcohol level:  Lab Results  Component Value Date   ETH <10 03/25/2022   ETH <10 99991111    Metabolic Disorder Labs: Lab Results  Component Value Date   HGBA1C 5.0 03/28/2022   MPG 96.8 03/28/2022   No results found for: "PROLACTIN" Lab Results  Component Value Date   CHOL 116 03/28/2022   TRIG 76 03/28/2022   HDL 44 03/28/2022   CHOLHDL 2.6 03/28/2022   VLDL 15 03/28/2022   LDLCALC 57 03/28/2022    Physical Findings: AIMS: Facial and Oral Movements Muscles of Facial Expression: None, normal Lips and Perioral Area: None, normal Jaw: None, normal Tongue: None, normal,Extremity Movements Upper (arms, wrists, hands, fingers): None, normal Lower (legs, knees, ankles, toes): None, normal, Trunk Movements Neck, shoulders, hips: None, normal, Overall Severity Severity of abnormal movements (highest score from questions above): None, normal Incapacitation due to abnormal movements: None, normal Patient's awareness of abnormal movements (rate only patient's report): No Awareness, Dental Status Current problems with teeth and/or dentures?: No Does patient usually wear dentures?: No  CIWA:    COWS:     Musculoskeletal: Strength & Muscle Tone: within normal limits Gait & Station: normal Patient leans: N/A  Psychiatric Specialty Exam: Physical Exam Vitals and nursing note reviewed.  HENT:     Head: Normocephalic.     Nose: Nose normal.  Pulmonary:     Effort: Pulmonary effort is normal.  Musculoskeletal:        General: Normal range of motion.     Cervical back: Normal range of motion.  Neurological:     General: No focal deficit present.     Mental Status: He is alert and oriented to person, place, and time.  Psychiatric:        Attention and Perception: He is inattentive.        Mood and Affect: Affect is flat.        Speech: He is  noncommunicative.        Behavior: Behavior is agitated.        Thought Content: Thought content is paranoid.        Cognition and Memory: Cognition is impaired.        Judgment: Judgment is inappropriate.     Review of Systems  Psychiatric/Behavioral:  Positive for hallucinations. The patient is nervous/anxious.  All other systems reviewed and are negative.   Blood pressure 105/68, pulse 86, temperature 99 F (37.2 C), temperature source Oral, resp. rate 16, height 5\' 7"  (1.702 m), weight 65.8 kg, SpO2 100 %.Body mass index is 22.72 kg/m.  General Appearance: Disheveled  Eye Contact:  Fair  Speech:  Negative  Volume:  incomprehensible  Mood:  Anxious  Affect:  Flat  Thought Process:  UTA, not talking   Orientation:  aert  Thought Content:  UTA  Suicidal Thoughts:  UTA  Homicidal Thoughts:  UTA  Memory:  UTA  Judgement:  Impaired  Insight:  Lacking  Psychomotor Activity:  Increased  Concentration:  Concentration: Poor and Attention Span: Poor  Recall:  of Knowledge:  UTA  Language:  Negative  Akathisia:  No  Handed:  Right  AIMS (if indicated):     Assets:  Leisure Time Physical Health Resilience  ADL's:  Intact  Cognition:  Impaired,  Moderate  Sleep:         Physical Exam: Physical Exam Vitals and nursing note reviewed.  HENT:     Head: Normocephalic.     Nose: Nose normal.  Pulmonary:     Effort: Pulmonary effort is normal.  Musculoskeletal:        General: Normal range of motion.     Cervical back: Normal range of motion.  Neurological:     General: No focal deficit present.     Mental Status: He is alert and oriented to person, place, and time.  Psychiatric:        Attention and Perception: He is inattentive.        Mood and Affect: Affect is flat.        Speech: He is noncommunicative.        Behavior: Behavior is agitated.        Thought Content: Thought content is paranoid.        Cognition and Memory: Cognition is impaired.         Judgment: Judgment is inappropriate.    Review of Systems  Psychiatric/Behavioral:  Positive for hallucinations. The patient is nervous/anxious.   All other systems reviewed and are negative.  Blood pressure 105/68, pulse 86, temperature 99 F (37.2 C), temperature source Oral, resp. rate 16, height 5\' 7"  (1.702 m), weight 65.8 kg, SpO2 100 %. Body mass index is 22.72 kg/m.   Treatment Plan Summary: Medication management and Plan no change to antipsychotic medicine.  Added as needed medicine Imodium as well as standing pantoprazole.  Encourage patient to communicate with [employer]  Ongoing daily assessment of behavior.  Probably worth rechecking his blood counts tomorrow to make sure he is not losing more blood Schizophrenia, paranoid type: CBC ordered, refused earlier Klonopin 0.5 mg BID  Abdominal pain: Started Carafate 1 mg TID with meals Protonix 40 mg daily  , NP 03/31/2022, 3:05 PM

## 2022-03-31 NOTE — Progress Notes (Signed)
Patient becoming increasingly agitated, first patient was observed pacing on the hallway, he then began shaking his head vigorously and responding to internal stimuli. He started screaming and mumbling to himself and clenching his fists and then clenching jaw and pacing rapidly on the unit. Prn po medication ativan 2mg  po and vistaril 25mg  po offered to the patient, and he accepted after stating '' we are communicating threats and going to fuck you up ! '' Then began mumbling in word salad. Patient then continued to pace the unit with even more speed, came up to nurses station and punched window and screamed at writer stating he '' going to fucking pay ! Gesturing in obvious intimidating and agitated manner. ''  Patient continued to pace down the unit and going up and down incorrect hallways and approaching exit door on the unit. SHOW OF SUPPORT was called with security due to the immediate and rapid escalation in patients agitated behaviors and concerns for safety for staff and peers due to patients hx of violence. (Pt admitted from jail). The patient was approached by the MD and offered a cup of coffee which seemed to help the patient, he entered the dayroom and accepted it and then sat down. With a show of support the patient accepted emergent medication Geodon IM and Benadryl IM please refer to mar. NO manual hold was required however repeated several times to writer as giving medication '' everything is against my will. ''  MD present for event and aware of patients current acuity.  Pt remains very labile, unpredictable and acutely psychotic at this time. Staff continue to redirect frequently. Pt is safe. Will con't to monitor .

## 2022-03-31 NOTE — Progress Notes (Signed)
Patient ID: Danny Blackwell, male   DOB: 1970/12/25, 51 y.o.   MRN: [id number] Patient became very agitated in the later morning today.  No clear precipitant.  Patient was pacing around the unit cursing posturing at times.  Not responding well to attempts at verbal redirection.  Treatment team discussed the situation and recommended administering medication.  He had already received oral Ativan and had as needed orders for IM Geodon and Benadryl.  These were administered to the patient.  He did not put up any physical resistance and cooperated with the procedure and no hold was required.  At this time he still seems to be preoccupied internally agitated and cursing but is not physically threatening anyone.  Staff is allowing the patient to cool down apart from the rest of the milieu trying to avoid escalating the situation.  Clearly very psychotic.

## 2022-04-01 DIAGNOSIS — F200 Paranoid schizophrenia: Principal | ICD-10-CM

## 2022-04-01 NOTE — Progress Notes (Signed)
Patient presents with bizarre affect, mumbled speech. Patient observed interacting appropriately with staff and peers on the unit. Pt refused his night medication, given education, still refused. Pt given education, support, and encouragement to be active in his treatment plan. Pt being monitored Q 15 minutes for safety per unit protocol, remains safe on the unit.  

## 2022-04-01 NOTE — Group Note (Signed)
LCSW Group Therapy Note  Group Date: 04/01/2022 Start Time: 1300 End Time: 1400   Type of Therapy and Topic:  Group Therapy: Using "I" Statements  Participation Level:  Did Not Attend  Description of Group:  Patients were asked to provide details of some interpersonal conflicts they have experienced. Patients were then educated about "I" statements, communication which focuses on feelings or views of the speaker rather than what the other person is doing. T group members were asked to reflect on past conflicts and to provide specific examples for utilizing "I" statements.  Therapeutic Goals:  Patients will verbalize understanding of ineffective communication and effective communication. Patients will be able to empathize with whom they are having conflict. Patients will practice effective communication in the form of "I" statements.    Summary of Patient Progress:   Patient did not attend group despite encouraged participation.    Therapeutic Modalities:   Cognitive Behavioral Therapy Solution-Focused Therapy    Corky Crafts, Alexander Mt 04/01/2022  2:32 PM

## 2022-04-01 NOTE — Progress Notes (Signed)
Patient had to be redirected from going into another patient's room. Patient walked into another patient's room and used the restroom. This writer attempted to explain to patient that he is not to go into other patient's room. Patient just walked out of the room, mumbling.

## 2022-04-01 NOTE — Progress Notes (Signed)
Patient spit out his scheduled Tums, stating to this writer "I don't have any fucking teeth". MD will be notified.

## 2022-04-01 NOTE — Plan of Care (Signed)
D- Patient alert and oriented to person and situation. Patient presented in an apprehensive, and slightly irritable mood on assessment. Patient continues to [facility] when this writer attempts to assess him. Patient's speech is mostly incomprehensible until he wants something, but he did say two words that this writer could understand, and he stated "rape", and "assault". Patient continues to refuse morning vitals. This writer is unable to assess if patient is experiencing SI, HI, AVH, and pain, however, patient does not appear to be in any distress nor pain. Patient has also not voiced if he is experiencing any signs/symptoms of depression/anxiety. Patient had no stated goals for today.   A- Scheduled medications administered to patient, per MD orders. Support and encouragement provided.  Routine safety checks conducted every 15 minutes.  Patient informed to notify staff with problems or concerns.  R- No adverse drug reactions noted. Patient compliant with medications, after staff encouragement. Patient remains safe at this time.  Problem: Education: Goal: Knowledge of General Education information will improve Description: Including pain rating scale, medication(s)/side effects and non-pharmacologic comfort measures Outcome: Not Progressing   Problem: Health Behavior/Discharge Planning: Goal: Ability to manage health-related needs will improve Outcome: Not Progressing   Problem: Clinical Measurements: Goal: Ability to maintain clinical measurements within normal limits will improve Outcome: Not Progressing Goal: Will remain free from infection Outcome: Not Progressing Goal: Diagnostic test results will improve Outcome: Not Progressing Goal: Respiratory complications will improve Outcome: Not Progressing Goal: Cardiovascular complication will be avoided Outcome: Not Progressing   Problem: Activity: Goal: Risk for activity intolerance will decrease Outcome: Not Progressing   Problem:  Nutrition: Goal: Adequate nutrition will be maintained Outcome: Not Progressing   Problem: Coping: Goal: Level of anxiety will decrease Outcome: Not Progressing   Problem: Elimination: Goal: Will not experience complications related to bowel motility Outcome: Not Progressing Goal: Will not experience complications related to urinary retention Outcome: Not Progressing   Problem: Pain Managment: Goal: General experience of comfort will improve Outcome: Not Progressing   Problem: Safety: Goal: Ability to remain free from injury will improve Outcome: Not Progressing   Problem: Skin Integrity: Goal: Risk for impaired skin integrity will decrease Outcome: Not Progressing

## 2022-04-01 NOTE — Progress Notes (Signed)
Recreation Therapy Notes   Date: 04/01/2022  Time: 10:15 am    Location: Courtyard     Behavioral response: N/A   Intervention Topic: Leisure     Discussion/Intervention: Patient refused to attend group.   Clinical Observations/Feedback:  Patient refused to attend group.    Kaan Tosh LRT/CTRS        [name] 04/01/2022 12:52 PM

## 2022-04-01 NOTE — Progress Notes (Signed)
[facility] MD Progress Note  04/01/2022 11:04 AM JONH MCQUEARY   MRN:  [id number]  Subjective: Follow-up for this patient with schizophrenia.   He was in his room lying on his bed with many drink cups at his side on the floor.  Attempted to engage him in conversation with no results besides incomprehensible mumbling, no outward signs of discomfort noted.  Principal Problem: Schizophrenia, paranoid (HCC) Diagnosis: Principal Problem:   Schizophrenia, paranoid (HCC)  Total Time spent with patient: 30 minutes  Past Psychiatric History: Past history of schizophrenia  Past Medical History: History reviewed. No pertinent past medical history. History reviewed. No pertinent surgical history. Family History: History reviewed. No pertinent family history. Family Psychiatric  History: See previous Social History:  Social History   Substance and Sexual Activity  Alcohol Use None     Social History   Substance and Sexual Activity  Drug Use Not on file    Social History   Socioeconomic History   Marital status: Unknown    Spouse name: Not on file   Number of children: Not on file   Years of education: Not on file   Highest education level: Not on file  Occupational History   Not on file  Tobacco Use   Smoking status: Every Day    Packs/day: 1.00    Years: 15.00    Total pack years: 15.00    Types: Cigarettes   Smokeless tobacco: Never  Vaping Use   Vaping Use: Unknown  Substance and Sexual Activity   Alcohol use: Not on file   Drug use: Not on file   Sexual activity: Not on file  Other Topics Concern   Not on file  Social History Narrative   ** Merged History Encounter **       Social Determinants of Health   Financial Resource Strain: Not on file  Food Insecurity: Not on file  Transportation Needs: Not on file  Physical Activity: Not on file  Stress: Not on file  Social Connections: Not on file   Additional Social History: homeless  Sleep: Fair  Appetite:   Fair  Current Medications: Current Facility-Administered Medications  Medication Dose Route Frequency Provider Last Rate Last Admin   acetaminophen (TYLENOL) tablet 650 mg  650 mg Oral Q6H PRN Vanetta Mulders, NP       alum & mag hydroxide-simeth (MAALOX/MYLANTA) 200-200-20 MG/5ML suspension 30 mL  30 mL Oral Q4H PRN Vanetta Mulders, NP       calcium carbonate (TUMS - dosed in mg elemental calcium) chewable tablet 200 mg of elemental calcium  1 tablet Oral BID WC Clapacs, John T, MD   200 mg of elemental calcium at 03/31/22 1738   clonazePAM (KLONOPIN) tablet 0.5 mg  0.5 mg Oral BID Charm Rings, NP   0.5 mg at 04/01/22 9924   diphenhydrAMINE (BENADRYL) injection 50 mg  50 mg Intravenous Q6H PRN Clapacs, John T, MD       feeding supplement (ENSURE ENLIVE / ENSURE PLUS) liquid 237 mL  237 mL Oral BID BM Clapacs, John T, MD   237 mL at 03/31/22 1300   ferrous sulfate tablet 325 mg  325 mg Oral Q breakfast Clapacs, Jackquline Denmark, MD   325 mg at 04/01/22 2683   haloperidol (HALDOL) tablet 4 mg  4 mg Oral BID Clapacs, Jackquline Denmark, MD   4 mg at 04/01/22 4196   Or   haloperidol lactate (HALDOL) injection 2 mg  2 mg Intramuscular BID  Clapacs, Jackquline Denmark, MD       hydrOXYzine (ATARAX) tablet 25 mg  25 mg Oral TID PRN Vanetta Mulders, NP   25 mg at 03/31/22 1106   loperamide (IMODIUM) capsule 2 mg  2 mg Oral PRN Clapacs, Jackquline Denmark, MD       LORazepam (ATIVAN) tablet 2 mg  2 mg Oral Q6H PRN Clapacs, Jackquline Denmark, MD   2 mg at 03/31/22 1106   Or   LORazepam (ATIVAN) injection 2 mg  2 mg Intramuscular Q6H PRN Clapacs, John T, MD       magnesium hydroxide (MILK OF MAGNESIA) suspension 30 mL  30 mL Oral Daily PRN Gabriel Cirri F, NP       pantoprazole (PROTONIX) EC tablet 40 mg  40 mg Oral Daily Clapacs, John T, MD   40 mg at 04/01/22 0821   sucralfate (CARAFATE) tablet 1 g  1 g Oral TID WC & HS Charm Rings, NP   1 g at 04/01/22 3614   ziprasidone (GEODON) injection 20 mg  20 mg Intramuscular Q12H PRN Clapacs,  Jackquline Denmark, MD   20 mg at 03/31/22 1117    Lab Results: No results found for this or any previous visit (from the past 48 hour(s)).  Blood Alcohol level:  Lab Results  Component Value Date   ETH <10 03/25/2022   ETH <10 10/11/2021    Metabolic Disorder Labs: Lab Results  Component Value Date   HGBA1C 5.0 03/28/2022   MPG 96.8 03/28/2022   No results found for: "PROLACTIN" Lab Results  Component Value Date   CHOL 116 03/28/2022   TRIG 76 03/28/2022   HDL 44 03/28/2022   CHOLHDL 2.6 03/28/2022   VLDL 15 03/28/2022   LDLCALC 57 03/28/2022    Physical Findings: AIMS: Facial and Oral Movements Muscles of Facial Expression: None, normal Lips and Perioral Area: None, normal Jaw: None, normal Tongue: None, normal,Extremity Movements Upper (arms, wrists, hands, fingers): None, normal Lower (legs, knees, ankles, toes): None, normal, Trunk Movements Neck, shoulders, hips: None, normal, Overall Severity Severity of abnormal movements (highest score from questions above): None, normal Incapacitation due to abnormal movements: None, normal Patient's awareness of abnormal movements (rate only patient's report): No Awareness, Dental Status Current problems with teeth and/or dentures?: No Does patient usually wear dentures?: No  CIWA:    COWS:     Musculoskeletal: Strength & Muscle Tone: within normal limits Gait & Station: normal Patient leans: N/A  Psychiatric Specialty Exam: Physical Exam Vitals and nursing note reviewed.  HENT:     Head: Normocephalic.     Nose: Nose normal.  Pulmonary:     Effort: Pulmonary effort is normal.  Musculoskeletal:        General: Normal range of motion.     Cervical back: Normal range of motion.  Neurological:     General: No focal deficit present.     Mental Status: He is alert and oriented to person, place, and time.  Psychiatric:        Attention and Perception: He is inattentive.        Mood and Affect: Affect is flat.         Speech: He is noncommunicative.        Behavior: Behavior is agitated.        Thought Content: Thought content is paranoid.        Cognition and Memory: Cognition is impaired.        Judgment: Judgment is  inappropriate.     Review of Systems  Psychiatric/Behavioral:  Positive for hallucinations. The patient is nervous/anxious.   All other systems reviewed and are negative.   Blood pressure 105/68, pulse 86, temperature 99 F (37.2 C), temperature source Oral, resp. rate 16, height 5\' 7"  (1.702 m), weight 65.8 kg, SpO2 100 %.Body mass index is 22.72 kg/m.  General Appearance: Disheveled  Eye Contact:  Fair  Speech:  Negative  Volume:  incomprehensible  Mood:  Anxious  Affect:  Flat  Thought Process:  UTA, not talking   Orientation:  aert  Thought Content:  UTA  Suicidal Thoughts:  UTA  Homicidal Thoughts:  UTA  Memory:  UTA  Judgement:  Impaired  Insight:  Lacking  Psychomotor Activity:  Increased  Concentration:  Concentration: Poor and Attention Span: Poor  Recall:  of Knowledge:  UTA  Language:  Negative  Akathisia:  No  Handed:  Right  AIMS (if indicated):     Assets:  Leisure Time Physical Health Resilience  ADL's:  Intact  Cognition:  Impaired,  Moderate  Sleep:         Physical Exam: Physical Exam Vitals and nursing note reviewed.  HENT:     Head: Normocephalic.     Nose: Nose normal.  Pulmonary:     Effort: Pulmonary effort is normal.  Musculoskeletal:        General: Normal range of motion.     Cervical back: Normal range of motion.  Neurological:     General: No focal deficit present.     Mental Status: He is alert and oriented to person, place, and time.  Psychiatric:        Attention and Perception: He is inattentive.        Mood and Affect: Affect is flat.        Speech: He is noncommunicative.        Behavior: Behavior is agitated.        Thought Content: Thought content is paranoid.        Cognition and Memory: Cognition is  impaired.        Judgment: Judgment is inappropriate.    Review of Systems  Psychiatric/Behavioral:  Positive for hallucinations. The patient is nervous/anxious.   All other systems reviewed and are negative.  Blood pressure 105/68, pulse 86, temperature 99 F (37.2 C), temperature source Oral, resp. rate 16, height 5\' 7"  (1.702 m), weight 65.8 kg, SpO2 100 %. Body mass index is 22.72 kg/m.   Treatment Plan Summary: Medication management and Plan no change to antipsychotic medicine.  Added as needed medicine Imodium as well as standing pantoprazole.  Encourage patient to communicate with [employer]  Ongoing daily assessment of behavior.  Probably worth rechecking his blood counts tomorrow to make sure he is not losing more blood Schizophrenia, paranoid type: CBC ordered, refused earlier Klonopin 0.5 mg BID  Abdominal pain: Carafate 1 mg TID with meals Protonix 40 mg daily  , NP 04/01/2022, 11:04 AM

## 2022-04-02 DIAGNOSIS — F203 Undifferentiated schizophrenia: Secondary | ICD-10-CM | POA: Diagnosis not present

## 2022-04-02 MED ORDER — HALOPERIDOL LACTATE 5 MG/ML IJ SOLN: 5.0000 mg | Freq: Two times a day (BID) | INTRAMUSCULAR | Status: DC

## 2022-04-02 MED ORDER — HALOPERIDOL 5 MG PO TABS
5.0000 mg | ORAL_TABLET | Freq: Two times a day (BID) | ORAL | Status: DC
  Administered 2022-04-02 – 2022-04-03 (×2): 5 mg via ORAL
  Filled 2022-04-02 (×2): qty 1

## 2022-04-02 MED ORDER — PANTOPRAZOLE SODIUM 40 MG PO TBEC
40.0000 mg | DELAYED_RELEASE_TABLET | Freq: Two times a day (BID) | ORAL | Status: DC
  Administered 2022-04-02 – 2022-04-10 (×17): 40 mg via ORAL
  Filled 2022-04-02 (×16): qty 1

## 2022-04-02 NOTE — Plan of Care (Signed)
D- Patient alert and oriented to person and situation. Patient presented in a suspicious, preoccupied, but pleasant mood on assessment. When this writer asks patient questions, all he does is mumble. Patient's speech is incomprehensible, until he wants something, and he will talk to you as clear as day. Patient continues to refuse morning vitals, but he has been more compliant with taking his medication when asked, although he takes a few seconds to do so. This writer is still unable to assess if patient is experiencing SI, HI, AVH, and pain. However, patient does not appear to be in any acute pain nor distress. Patient has not voiced if he is experiencing any signs/symptoms of depression/anxiety. Patient had no stated goals for today.  A- Scheduled medications administered to patient, per MD orders. Support and encouragement provided.  Routine safety checks conducted every 15 minutes.  Patient informed to notify staff with problems or concerns.  R- No adverse drug reactions noted. Patient contracts for safety at this time. Patient compliant with medications. Patient remains safe at this time.  Problem: Education: Goal: Knowledge of General Education information will improve Description: Including pain rating scale, medication(s)/side effects and non-pharmacologic comfort measures Outcome: Progressing   Problem: Health Behavior/Discharge Planning: Goal: Ability to manage health-related needs will improve Outcome: Progressing   Problem: Clinical Measurements: Goal: Ability to maintain clinical measurements within normal limits will improve Outcome: Progressing Goal: Will remain free from infection Outcome: Progressing Goal: Diagnostic test results will improve Outcome: Progressing Goal: Respiratory complications will improve Outcome: Progressing Goal: Cardiovascular complication will be avoided Outcome: Progressing   Problem: Activity: Goal: Risk for activity intolerance will  decrease Outcome: Progressing   Problem: Nutrition: Goal: Adequate nutrition will be maintained Outcome: Progressing   Problem: Coping: Goal: Level of anxiety will decrease Outcome: Progressing   Problem: Elimination: Goal: Will not experience complications related to bowel motility Outcome: Progressing Goal: Will not experience complications related to urinary retention Outcome: Progressing   Problem: Pain Managment: Goal: General experience of comfort will improve Outcome: Progressing   Problem: Safety: Goal: Ability to remain free from injury will improve Outcome: Progressing   Problem: Skin Integrity: Goal: Risk for impaired skin integrity will decrease Outcome: Progressing

## 2022-04-02 NOTE — BH IP Treatment Plan (Signed)
Interdisciplinary Treatment and Diagnostic Plan Update  04/02/2022 Time of Session: 8:30AM Danny Blackwell MRN: [id number]  Principal Diagnosis: Schizophrenia, paranoid ([place])  Secondary Diagnoses: Principal Problem:   Schizophrenia, paranoid ([place])   Current Medications:  Current Facility-Administered Medications  Medication Dose Route Frequency Provider Last Rate Last Admin   acetaminophen (TYLENOL) tablet 650 mg  650 mg Oral Q6H PRN Sherlon Handing, NP       alum & mag hydroxide-simeth (MAALOX/MYLANTA) 200-200-20 MG/5ML suspension 30 mL  30 mL Oral Q4H PRN Sherlon Handing, NP       calcium carbonate (TUMS - dosed in mg elemental calcium) chewable tablet 200 mg of elemental calcium  1 tablet Oral BID WC Clapacs, John T, MD   200 mg of elemental calcium at 03/31/22 1738   clonazePAM (KLONOPIN) tablet 0.5 mg  0.5 mg Oral BID Patrecia Pour, NP   0.5 mg at 04/02/22 0300   diphenhydrAMINE (BENADRYL) injection 50 mg  50 mg Intravenous Q6H PRN Clapacs, Madie Reno, MD       feeding supplement (ENSURE ENLIVE / ENSURE PLUS) liquid 237 mL  237 mL Oral BID BM Clapacs, John T, MD   237 mL at 04/01/22 1405   ferrous sulfate tablet 325 mg  325 mg Oral Q breakfast Clapacs, Madie Reno, MD   325 mg at 04/02/22 0802   haloperidol (HALDOL) tablet 4 mg  4 mg Oral BID Clapacs, Madie Reno, MD   4 mg at 04/02/22 0800   Or   haloperidol lactate (HALDOL) injection 2 mg  2 mg Intramuscular BID Clapacs, Madie Reno, MD       hydrOXYzine (ATARAX) tablet 25 mg  25 mg Oral TID PRN Sherlon Handing, NP   25 mg at 03/31/22 1106   loperamide (IMODIUM) capsule 2 mg  2 mg Oral PRN Clapacs, Madie Reno, MD       LORazepam (ATIVAN) tablet 2 mg  2 mg Oral Q6H PRN Clapacs, Madie Reno, MD   2 mg at 03/31/22 1106   Or   LORazepam (ATIVAN) injection 2 mg  2 mg Intramuscular Q6H PRN Clapacs, Madie Reno, MD       magnesium hydroxide (MILK OF MAGNESIA) suspension 30 mL  30 mL Oral Daily PRN Sherlon Handing, NP       pantoprazole (PROTONIX) EC  tablet 40 mg  40 mg Oral Daily Clapacs, John T, MD   40 mg at 04/02/22 0801   sucralfate (CARAFATE) tablet 1 g  1 g Oral TID WC & HS Patrecia Pour, NP   1 g at 04/02/22 0801   ziprasidone (GEODON) injection 20 mg  20 mg Intramuscular Q12H PRN Clapacs, Madie Reno, MD   20 mg at 03/31/22 1117   PTA Medications: Medications Prior to Admission  Medication Sig Dispense Refill Last Dose   OLANZapine (ZYPREXA) 2.5 MG tablet Take 1 tablet (2.5 mg total) by mouth 2 (two) times daily. (Patient not taking: Reported on 03/25/2022) 60 tablet 1     Patient Stressors: Legal issue   Medication change or noncompliance    Patient Strengths: Psychologist, clinical for treatment/growth   Treatment Modalities: Medication Management, Group therapy, Case management,  1 to 1 session with clinician, Psychoeducation, Recreational therapy.   Physician Treatment Plan for Primary Diagnosis: Schizophrenia, paranoid ([place]) Long Term Goal(s): Improvement in symptoms so as ready for discharge   Short Term Goals: Ability to maintain clinical measurements within normal limits will improve Compliance with  prescribed medications will improve Ability to verbalize feelings will improve Ability to demonstrate self-control will improve Ability to identify and develop effective coping behaviors will improve  Medication Management: Evaluate patient's response, side effects, and tolerance of medication regimen.  Therapeutic Interventions: 1 to 1 sessions, Unit Group sessions and Medication administration.  Evaluation of Outcomes: Not Met  Physician Treatment Plan for Secondary Diagnosis: Principal Problem:   Schizophrenia, paranoid ([place])  Long Term Goal(s): Improvement in symptoms so as ready for discharge   Short Term Goals: Ability to maintain clinical measurements within normal limits will improve Compliance with prescribed medications will improve Ability to verbalize feelings will improve Ability to  demonstrate self-control will improve Ability to identify and develop effective coping behaviors will improve     Medication Management: Evaluate patient's response, side effects, and tolerance of medication regimen.  Therapeutic Interventions: 1 to 1 sessions, Unit Group sessions and Medication administration.  Evaluation of Outcomes: Progressing   RN Treatment Plan for Primary Diagnosis: Schizophrenia, paranoid ([place]) Long Term Goal(s): Knowledge of disease and therapeutic regimen to maintain health will improve  Short Term Goals: Ability to remain free from injury will improve, Ability to verbalize frustration and anger appropriately will improve, Ability to demonstrate self-control, Ability to participate in decision making will improve, Ability to verbalize feelings will improve, Ability to disclose and discuss suicidal ideas, Ability to identify and develop effective coping behaviors will improve, and Compliance with prescribed medications will improve  Medication Management: RN will administer medications as ordered by provider, will assess and evaluate patient's response and provide education to patient for prescribed medication. RN will report any adverse and/or side effects to prescribing provider.  Therapeutic Interventions: 1 on 1 counseling sessions, Psychoeducation, Medication administration, Evaluate responses to treatment, Monitor vital signs and CBGs as ordered, Perform/monitor CIWA, COWS, AIMS and Fall Risk screenings as ordered, Perform wound care treatments as ordered.  Evaluation of Outcomes: Progressing   LCSW Treatment Plan for Primary Diagnosis: Schizophrenia, paranoid ([place]) Long Term Goal(s): Safe transition to appropriate next level of care at discharge, Engage patient in therapeutic group addressing interpersonal concerns.  Short Term Goals: Engage patient in aftercare planning with referrals and resources, Increase social support, Increase ability to appropriately  verbalize feelings, Increase emotional regulation, Facilitate acceptance of mental health diagnosis and concerns, and Increase skills for wellness and recovery  Therapeutic Interventions: Assess for all discharge needs, 1 to 1 time with Social worker, Explore available resources and support systems, Assess for adequacy in community support network, Educate family and significant other(s) on suicide prevention, Complete Psychosocial Assessment, Interpersonal group therapy.  Evaluation of Outcomes: Progressing   Progress in Treatment: Attending groups: No. Participating in groups: No. Taking medication as prescribed: Yes. Toleration medication: Yes. Family/Significant other contact made: No, will contact:  if given permission. Patient understands diagnosis: No. Discussing patient identified problems/goals with staff: No. Medical problems stabilized or resolved: Yes. Denies suicidal/homicidal ideation: No. Issues/concerns per patient self-inventory: No. Other: none.  New problem(s) identified: No, Describe:  none identified. Update 04/02/22: No changes at this time.   New Short Term/Long Term Goal(s): elimination of symptoms of psychosis, medication management for mood stabilization; elimination of SI thoughts; development of comprehensive mental wellness plan. Update 04/02/22: No changes at this time.   Patient Goals:  Pt declined to participate in treatment team meeting despite individual extended invitation. Update 04/02/22: No changes at this time.   Discharge Plan or Barriers: CSW will assist pt with development of an appropriate aftercare/discharge plan. Update  04/02/22: No changes at this time.   Reason for Continuation of Hospitalization: Medication stabilization Other; describe psychosis   Estimated Length of Stay: 1-7 days Update 04/02/22: TBD  Last 3 [place] Suicide Severity Risk Score: Flowsheet Row Admission (Current) from 03/26/2022 in [facility]  from 03/25/2022 in [facility] from 10/11/2021 in [facility] No Risk No Risk No Risk       Last PHQ 2/9 Scores:     No data to display          Scribe for Treatment Team: Shirl Harris, LCSW 04/02/2022 10:12 AM

## 2022-04-02 NOTE — Progress Notes (Signed)
Patient allowed this writer to obtain a set of vitals on him, without any issues.

## 2022-04-02 NOTE — Progress Notes (Signed)
Recreation Therapy Notes  Date: 04/02/2022  Time: 11:05 am    Location: Courtyard     Behavioral response: N/A   Intervention Topic: Social Skills    Discussion/Intervention: Patient refused to attend group.   Clinical Observations/Feedback:  Patient refused to attend group.    Sianne Tejada LRT/CTRS        Danny Blackwell 04/02/2022 12:47 PM 

## 2022-04-02 NOTE — Group Note (Signed)
[facility] LCSW Group Therapy Note   Group Date: 04/02/2022 Start Time: 1300 End Time: 1400   Type of Therapy/Topic:  Group Therapy:  Emotion Regulation  Participation Level:  Did Not Attend    Description of Group:    The purpose of this group is to assist patients in learning to regulate negative emotions and experience positive emotions. Patients will be guided to discuss ways in which they have been vulnerable to their negative emotions. These vulnerabilities will be juxtaposed with experiences of positive emotions or situations, and patients challenged to use positive emotions to combat negative ones. Special emphasis will be placed on coping with negative emotions in conflict situations, and patients will process healthy conflict resolution skills.  Therapeutic Goals: Patient will identify two positive emotions or experiences to reflect on in order to balance out negative emotions:  Patient will label two or more emotions that they find the most difficult to experience:  Patient will be able to demonstrate positive conflict resolution skills through discussion or role plays:   Summary of Patient Progress: Patient declined to attend group despite invitation by CSW.   Therapeutic Modalities:   Cognitive Behavioral Therapy Feelings Identification Dialectical Behavioral Therapy   Cardarius Senat R Trella Thurmond, LCSW 

## 2022-04-02 NOTE — Progress Notes (Signed)
Patient just came up to the nurses station door and yelled into the window, looking at the staff, and then he walked off.

## 2022-04-02 NOTE — Progress Notes (Signed)
[facility] MD Progress Note  04/02/2022 3:50 PM Danny Blackwell  MRN:  [id number] Subjective: Follow-up for this 51 year old man with schizophrenia.  Patient spoke a little bit more today although he still will not have a real conversation and is very paranoid.  He asked for things to help with his stomach pain specifically beverages which I supplied for him.  He will not answer any other questions about his life or mental state.  I believe that it is very likely that he has an ulcer or similar kind of stomach problem and have ask him to please let us draw some more blood tests including a repeat blood count but he has continued to refuse. Principal Problem: Schizophrenia, paranoid ([facility]) Diagnosis: Principal Problem:   Schizophrenia, paranoid ([facility])  Total Time spent with patient: 30 minutes  Past Psychiatric History: Unknown  Past Medical History: History reviewed. No pertinent past medical history. History reviewed. No pertinent surgical history. Family History: History reviewed. No pertinent family history. Family Psychiatric  History: Unknown Social History:  Social History   Substance and Sexual Activity  Alcohol Use None     Social History   Substance and Sexual Activity  Drug Use Not on file    Social History   Socioeconomic History   Marital status: Unknown    Spouse name: Not on file   Number of children: Not on file   Years of education: Not on file   Highest education level: Not on file  Occupational History   Not on file  Tobacco Use   Smoking status: Every Day    Packs/day: 1.00    Years: 15.00    Total pack years: 15.00    Types: Cigarettes   Smokeless tobacco: Never  Vaping Use   Vaping Use: Unknown  Substance and Sexual Activity   Alcohol use: Not on file   Drug use: Not on file   Sexual activity: Not on file  Other Topics Concern   Not on file  Social History Narrative   ** Merged History Encounter **       Social Determinants of Health   Financial  Resource Strain: Not on file  Food Insecurity: Not on file  Transportation Needs: Not on file  Physical Activity: Not on file  Stress: Not on file  Social Connections: Not on file   Additional Social History:                         Sleep: Fair  Appetite:  Fair  Current Medications: Current Facility-Administered Medications  Medication Dose Route Frequency Provider Last Rate Last Admin   acetaminophen (TYLENOL) tablet 650 mg  650 mg Oral Q6H PRN Vanetta Mulders, NP       alum & mag hydroxide-simeth (MAALOX/MYLANTA) 200-200-20 MG/5ML suspension 30 mL  30 mL Oral Q4H PRN Vanetta Mulders, NP       calcium carbonate (TUMS - dosed in mg elemental calcium) chewable tablet 200 mg of elemental calcium  1 tablet Oral BID WC Kodi Guerrera T, MD   200 mg of elemental calcium at 03/31/22 1738   clonazePAM (KLONOPIN) tablet 0.5 mg  0.5 mg Oral BID Charm Rings, NP   0.5 mg at 04/02/22 0802   diphenhydrAMINE (BENADRYL) injection 50 mg  50 mg Intravenous Q6H PRN Sashia Campas T, MD       feeding supplement (ENSURE ENLIVE / ENSURE PLUS) liquid 237 mL  237 mL Oral BID BM Tahji   T, MD   237 mL at 04/02/22 1326   ferrous sulfate tablet 325 mg  325 mg Oral Q breakfast Latangela Mccomas, Jackquline Denmark, MD   325 mg at 04/02/22 0802   haloperidol (HALDOL) tablet 5 mg  5 mg Oral BID Sierria Bruney, Jackquline [place], MD       Or   haloperidol lactate (HALDOL) injection 5 mg  5 mg Intramuscular BID Makela Niehoff, Jackquline [place], MD       hydrOXYzine (ATARAX) tablet 25 mg  25 mg Oral TID PRN Vanetta Mulders, NP   25 mg at 03/31/22 1106   loperamide (IMODIUM) capsule 2 mg  2 mg Oral PRN Anterrio Mccleery, Jackquline Denmark, MD       LORazepam (ATIVAN) tablet 2 mg  2 mg Oral Q6H PRN Jontae Adebayo, Jackquline Denmark, MD   2 mg at 03/31/22 1106   Or   LORazepam (ATIVAN) injection 2 mg  2 mg Intramuscular Q6H PRN Llesenia Fogal, Jackquline [place], MD       magnesium hydroxide (MILK OF MAGNESIA) suspension 30 mL  30 mL Oral Daily PRN Gabriel Cirri F, NP       pantoprazole (PROTONIX) EC  tablet 40 mg  40 mg Oral BID Meggen Spaziani T, MD       sucralfate (CARAFATE) tablet 1 g  1 g Oral TID WC & HS Charm Rings, NP   1 g at 04/02/22 1151   ziprasidone (GEODON) injection 20 mg  20 mg Intramuscular Q12H PRN Kenslie Abbruzzese, Jackquline [place], MD   20 mg at 03/31/22 1117    Lab Results: No results found for this or any previous visit (from the past 48 hour(s)).  Blood Alcohol level:  Lab Results  Component Value Date   ETH <10 03/25/2022   ETH <10 10/11/2021    Metabolic Disorder Labs: Lab Results  Component Value Date   HGBA1C 5.0 03/28/2022   MPG 96.8 03/28/2022   No results found for: "PROLACTIN" Lab Results  Component Value Date   CHOL 116 03/28/2022   TRIG 76 03/28/2022   HDL 44 03/28/2022   CHOLHDL 2.6 03/28/2022   VLDL 15 03/28/2022   LDLCALC 57 03/28/2022    Physical Findings: AIMS: Facial and Oral Movements Muscles of Facial Expression: None, normal Lips and Perioral Area: None, normal Jaw: None, normal Tongue: None, normal,Extremity Movements Upper (arms, wrists, hands, fingers): None, normal Lower (legs, knees, ankles, toes): None, normal, Trunk Movements Neck, shoulders, hips: None, normal, Overall Severity Severity of abnormal movements (highest score from questions above): None, normal Incapacitation due to abnormal movements: None, normal Patient's awareness of abnormal movements (rate only patient's report): No Awareness, Dental Status Current problems with teeth and/or dentures?: No Does patient usually wear dentures?: No  CIWA:    COWS:     Musculoskeletal: Strength & Muscle Tone: within normal limits Gait & Station: normal Patient leans: N/A  Psychiatric Specialty Exam:  Presentation  General Appearance: Bizarre; Disheveled  Eye Contact:Absent  Speech:Garbled  Speech Volume:Decreased  Handedness:Left   Mood and Affect  Mood:Dysphoric  Affect:Blunt; Inappropriate   Thought Process  Thought Processes:Disorganized;  Irrevelant  Descriptions of Associations:Loose  Orientation:-- (UTA)  Thought Content:Illogical; Delusions  History of Schizophrenia/Schizoaffective disorder:Yes  Duration of Psychotic Symptoms:Greater than six months  Hallucinations:No data recorded Ideas of Reference:Delusions; Paranoia  Suicidal Thoughts:No data recorded Homicidal Thoughts:No data recorded  Sensorium  Memory:-- (WILL NOT RESPOND TO QUESTIONS)  Judgment:Impaired  Insight:None   Executive Functions  Concentration:Poor  Attention Span:Poor  Recall:Poor  Fund of Knowledge:Poor  Language:Poor   Psychomotor Activity  Psychomotor Activity:No data recorded  Assets  Assets:Financial Resources/Insurance   Sleep  Sleep:No data recorded   Physical Exam: Physical Exam Vitals reviewed.  Constitutional:      Appearance: He is ill-appearing.  HENT:     Head: Normocephalic and atraumatic.     Mouth/Throat:     Pharynx: Oropharynx is clear.  Eyes:     Pupils: Pupils are equal, round, and reactive to light.  Cardiovascular:     Rate and Rhythm: Normal rate and regular rhythm.  Pulmonary:     Effort: Pulmonary effort is normal.     Breath sounds: Normal breath sounds.  Abdominal:     General: Abdomen is flat.     Palpations: Abdomen is soft.  Musculoskeletal:        General: Normal range of motion.  Skin:    General: Skin is warm and dry.  Neurological:     General: No focal deficit present.     Mental Status: He is alert. Mental status is at baseline.  Psychiatric:        Attention and Perception: He is inattentive.        Mood and Affect: Mood normal. Affect is blunt.        Speech: He is noncommunicative.        Behavior: Behavior is withdrawn.        Thought Content: Thought content is paranoid.    Review of Systems  Constitutional: Negative.   HENT: Negative.    Eyes: Negative.   Respiratory: Negative.    Cardiovascular: Negative.   Gastrointestinal:  Positive for abdominal  pain.  Musculoskeletal: Negative.   Skin: Negative.   Neurological: Negative.    Blood pressure 118/87, pulse (!) 116, temperature 98.2 F (36.8 C), temperature source Oral, resp. rate 16, height 5\' 7"  (1.702 m), weight 65.8 kg, SpO2 100 %. Body mass index is 22.72 kg/m.   Treatment Plan Summary: Plan increase pantoprazole to twice a day.  Trying to get blood counts.  Trying to form rapport providing him with beverages and other food that he asks for.  Increase Haldol to 5 mg twice a day  , MD 04/02/2022, 3:50 PM

## 2022-04-03 ENCOUNTER — Inpatient Hospital Stay: Payer: Medicaid Other

## 2022-04-03 DIAGNOSIS — F203 Undifferentiated schizophrenia: Secondary | ICD-10-CM | POA: Diagnosis not present

## 2022-04-03 MED ORDER — HALOPERIDOL LACTATE 5 MG/ML IJ SOLN: 7.5000 mg | Freq: Two times a day (BID) | INTRAMUSCULAR | Status: DC

## 2022-04-03 MED ORDER — HALOPERIDOL 5 MG PO TABS
7.5000 mg | ORAL_TABLET | Freq: Two times a day (BID) | ORAL | Status: DC
  Administered 2022-04-03 – 2022-04-04 (×2): 7.5 mg via ORAL
  Filled 2022-04-03 (×2): qty 2

## 2022-04-03 MED ORDER — IBUPROFEN 600 MG PO TABS: 600.0000 mg | ORAL_TABLET | Freq: Four times a day (QID) | ORAL | Status: DC | PRN
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
## 2022-04-03 NOTE — Plan of Care (Signed)
  Problem: Group Participation Goal: STG - Patient will engage in groups without prompting or encouragement from LRT x3 group sessions within 5 recreation therapy group sessions Description: STG - Patient will engage in groups without prompting or encouragement from LRT x3 group sessions within 5 recreation therapy group sessions Outcome: Not Progressing   
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
## 2022-04-03 NOTE — Group Note (Signed)
[facility] LCSW Group Therapy Note   Group Date: 04/03/2022 Start Time: 1310 End Time: 1315   Type of Therapy/Topic:  Group Therapy:  Balance in Life  Participation Level:  Did Not Attend   Description of Group:    This group will address the concept of balance and how it feels and looks when one is unbalanced. Patients will be encouraged to process areas in their lives that are out of balance, and identify reasons for remaining unbalanced. Facilitators will guide patients utilizing problem- solving interventions to address and correct the stressor making their life unbalanced. Understanding and applying boundaries will be explored and addressed for obtaining  and maintaining a balanced life. Patients will be encouraged to explore ways to assertively make their unbalanced needs known to significant others in their lives, using other group members and facilitator for support and feedback.  Therapeutic Goals: Patient will identify two or more emotions or situations they have that consume much of in their lives. Patient will identify signs/triggers that life has become out of balance:  Patient will identify two ways to set boundaries in order to achieve balance in their lives:  Patient will demonstrate ability to communicate their needs through discussion and/or role plays  Summary of Patient Progress:    Group unable to be held.  CSW provided patient with worksheets.  CSW assessed for any needs and encouraged patient to follow up if additional needs are identified.     Therapeutic Modalities:   Cognitive Behavioral Therapy Solution-Focused Therapy Assertiveness Training   Pasqualino Witherspoon J Aisia Correira, LCSW 

## 2022-04-03 NOTE — Plan of Care (Signed)
  Problem: Safety: Goal: Ability to remain free from injury will improve Outcome: Progressing   

## 2022-04-03 NOTE — Progress Notes (Signed)
Recreation Therapy Notes   Date: 04/03/2022   Time: 10:50 am    Location: Craft room      Behavioral response: N/A   Intervention Topic: Decision Making     Discussion/Intervention: Patient refused to attend group.    Clinical Observations/Feedback:  Patient refused to attend group.    Michaelah Credeur LRT/CTRS         Jiovanna Frei 04/03/2022 11:40 AM

## 2022-04-03 NOTE — Plan of Care (Signed)
D- Patient alert and oriented to person and situation. Patient presents again in a suspicious/preoccupied mood on assessment. Patient doesn't talk to this Clinical research associate, until he wants something, otherwise, he is mumbling and incoherent.  Patient continues to refuse morning vitals, but he has been more compliant with taking his medication when asked. This writer is still unable to assess if patient is experiencing SI, HI, AVH, and pain. However, patient does not appear to be in any acute pain nor distress. Patient has not voiced if he is experiencing any signs/symptoms of depression/anxiety. Patient had no stated goals for today.  A- Scheduled medications administered to patient, per MD orders. Support and encouragement provided.  Routine safety checks conducted every 15 minutes.  Patient informed to notify staff with problems or concerns.  R- No adverse drug reactions noted. Patient contracts for safety at this time. Patient compliant with medications, after taking a few moments of mumbling and snapping his fingers. Patient remains safe at this time.  Problem: Education: Goal: Knowledge of General Education information will improve Description: Including pain rating scale, medication(s)/side effects and non-pharmacologic comfort measures Outcome: Progressing   Problem: Health Behavior/Discharge Planning: Goal: Ability to manage health-related needs will improve Outcome: Progressing   Problem: Clinical Measurements: Goal: Ability to maintain clinical measurements within normal limits will improve Outcome: Progressing Goal: Will remain free from infection Outcome: Progressing Goal: Diagnostic test results will improve Outcome: Progressing Goal: Respiratory complications will improve Outcome: Progressing Goal: Cardiovascular complication will be avoided Outcome: Progressing   Problem: Activity: Goal: Risk for activity intolerance will decrease Outcome: Progressing   Problem: Nutrition: Goal:  Adequate nutrition will be maintained Outcome: Progressing   Problem: Coping: Goal: Level of anxiety will decrease Outcome: Progressing   Problem: Elimination: Goal: Will not experience complications related to bowel motility Outcome: Progressing Goal: Will not experience complications related to urinary retention Outcome: Progressing   Problem: Pain Managment: Goal: General experience of comfort will improve Outcome: Progressing   Problem: Safety: Goal: Ability to remain free from injury will improve Outcome: Progressing   Problem: Skin Integrity: Goal: Risk for impaired skin integrity will decrease Outcome: Progressing

## 2022-04-04 DIAGNOSIS — F203 Undifferentiated schizophrenia: Secondary | ICD-10-CM | POA: Diagnosis not present

## 2022-04-04 MED ORDER — HALOPERIDOL 5 MG PO TABS
10.0000 mg | ORAL_TABLET | Freq: Two times a day (BID) | ORAL | Status: DC
  Administered 2022-04-04 – 2022-04-07 (×6): 10 mg via ORAL
  Filled 2022-04-04 (×6): qty 2

## 2022-04-04 MED ORDER — HALOPERIDOL LACTATE 5 MG/ML IJ SOLN
7.5000 mg | Freq: Two times a day (BID) | INTRAMUSCULAR | Status: DC
  Filled 2022-04-04: qty 2

## 2022-04-04 NOTE — Group Note (Signed)
[facility] LCSW Group Therapy Note   Group Date: 04/04/2022 Start Time: 1430 End Time: 1530   Type of Therapy/Topic:  Group Therapy:  Emotion Regulation  Participation Level:  Did Not Attend    Description of Group:    The purpose of this group is to assist patients in learning to regulate negative emotions and experience positive emotions. Patients will be guided to discuss ways in which they have been vulnerable to their negative emotions. These vulnerabilities will be juxtaposed with experiences of positive emotions or situations, and patients challenged to use positive emotions to combat negative ones. Special emphasis will be placed on coping with negative emotions in conflict situations, and patients will process healthy conflict resolution skills.  Therapeutic Goals: Patient will identify two positive emotions or experiences to reflect on in order to balance out negative emotions:  Patient will label two or more emotions that they find the most difficult to experience:  Patient will be able to demonstrate positive conflict resolution skills through discussion or role plays:   Summary of Patient Progress: X   Therapeutic Modalities:   Cognitive Behavioral Therapy Feelings Identification Dialectical Behavioral Therapy   Debra Calabretta R Shloma Roggenkamp, LCSW 

## 2022-04-04 NOTE — Progress Notes (Signed)
Patient voiced no more concerns of pain. Uneventful rest of shift. Isolated to self, noted walking halls in no distress. Pt currently in bed resting, eyes closed.

## 2022-04-04 NOTE — [facility] Group Notes (Signed)
[facility] Group Notes:  (Nursing/MHT/Case Management/Adjunct)  Date:  04/04/2022  Time:  9:01 PM  Type of Therapy:   Wrap up  Participation Level:  None  Participation Quality:  Affect:  Lethargic and can't sit still and walking around wasting coffee  Cognitive:  Disorganized and needs bath.  Insight:  None  Engagement in Group:  Lacking    Summary of Progress/Problems:  Danny Blackwell 04/04/2022, 9:01 PM

## 2022-04-04 NOTE — Progress Notes (Signed)
[facility] MD Progress Note  04/04/2022 4:20 PM Danny Blackwell  MRN:  [id number] Subjective: Patient seen and chart reviewed.  Patient continues to be uncooperative and does not communicate well.  Rarely answers questions.  Only understandable conversation tends to be demands for things to eat or drink.  While engage in any kind of conversation that would be productive towards discharge planning.  Still seems very paranoid disorganized and withdrawn.  Frequently agitating with other patients. Principal Problem: Schizophrenia, paranoid (HCC) Diagnosis: Principal Problem:   Schizophrenia, paranoid (HCC)  Total Time spent with patient: 30 minutes  Past Psychiatric History: Unclear except that we know he has had at least 1 previous hospitalization with a diagnosis of schizophrenia  Past Medical History: History reviewed. No pertinent past medical history. History reviewed. No pertinent surgical history. Family History: History reviewed. No pertinent family history. Family Psychiatric  History: See previous Social History:  Social History   Substance and Sexual Activity  Alcohol Use None     Social History   Substance and Sexual Activity  Drug Use Not on file    Social History   Socioeconomic History   Marital status: Unknown    Spouse name: Not on file   Number of children: Not on file   Years of education: Not on file   Highest education level: Not on file  Occupational History   Not on file  Tobacco Use   Smoking status: Every Day    Packs/day: 1.00    Years: 15.00    Total pack years: 15.00    Types: Cigarettes   Smokeless tobacco: Never  Vaping Use   Vaping Use: Unknown  Substance and Sexual Activity   Alcohol use: Not on file   Drug use: Not on file   Sexual activity: Not on file  Other Topics Concern   Not on file  Social History Narrative   ** Merged History Encounter **       Social Determinants of Health   Financial Resource Strain: Not on file  Food  Insecurity: Not on file  Transportation Needs: Not on file  Physical Activity: Not on file  Stress: Not on file  Social Connections: Not on file   Additional Social History:                         Sleep: Fair  Appetite:  Good  Current Medications: Current Facility-Administered Medications  Medication Dose Route Frequency Provider Last Rate Last Admin   acetaminophen (TYLENOL) tablet 650 mg  650 mg Oral Q6H PRN Vanetta Mulders, NP       alum & mag hydroxide-simeth (MAALOX/MYLANTA) 200-200-20 MG/5ML suspension 30 mL  30 mL Oral Q4H PRN Gabriel Cirri F, NP       clonazePAM (KLONOPIN) tablet 0.5 mg  0.5 mg Oral BID Shaune Pollack, Jamison Y, NP   0.5 mg at 04/04/22 7124   diphenhydrAMINE (BENADRYL) injection 50 mg  50 mg Intravenous Q6H PRN Tayjon Halladay T, MD       feeding supplement (ENSURE ENLIVE / ENSURE PLUS) liquid 237 mL  237 mL Oral BID BM Airelle Everding T, MD   237 mL at 04/04/22 1349   ferrous sulfate tablet 325 mg  325 mg Oral Q breakfast Esco Joslyn, Jackquline Denmark, MD   325 mg at 04/04/22 5809   haloperidol (HALDOL) tablet 10 mg  10 mg Oral BID Hisae Decoursey, Jackquline Denmark, MD       Or   haloperidol lactate (  HALDOL) injection 7.5 mg  7.5 mg Intramuscular BID Danene Montijo, Jackquline Denmark, MD       hydrOXYzine (ATARAX) tablet 25 mg  25 mg Oral TID PRN Vanetta Mulders, NP   25 mg at 03/31/22 1106   ibuprofen (ADVIL) tablet 600 mg  600 mg Oral Q6H PRN Roxsana Riding, Jackquline Denmark, MD       loperamide (IMODIUM) capsule 2 mg  2 mg Oral PRN Darra Rosa, Jackquline Denmark, MD       LORazepam (ATIVAN) tablet 2 mg  2 mg Oral Q6H PRN Cypher Paule, Jackquline Denmark, MD   2 mg at 03/31/22 1106   Or   LORazepam (ATIVAN) injection 2 mg  2 mg Intramuscular Q6H PRN Evaline Waltman T, MD       magnesium hydroxide (MILK OF MAGNESIA) suspension 30 mL  30 mL Oral Daily PRN Gabriel Cirri F, NP       pantoprazole (PROTONIX) EC tablet 40 mg  40 mg Oral BID Constancia Geeting T, MD   40 mg at 04/04/22 0829   sucralfate (CARAFATE) tablet 1 g  1 g Oral TID WC & HS Charm Rings, NP   1 g at 04/04/22 1152   ziprasidone (GEODON) injection 20 mg  20 mg Intramuscular Q12H PRN Quiera Diffee, Jackquline Denmark, MD   20 mg at 03/31/22 1117    Lab Results: No results found for this or any previous visit (from the past 48 hour(s)).  Blood Alcohol level:  Lab Results  Component Value Date   ETH <10 03/25/2022   ETH <10 10/11/2021    Metabolic Disorder Labs: Lab Results  Component Value Date   HGBA1C 5.0 03/28/2022   MPG 96.8 03/28/2022   No results found for: "PROLACTIN" Lab Results  Component Value Date   CHOL 116 03/28/2022   TRIG 76 03/28/2022   HDL 44 03/28/2022   CHOLHDL 2.6 03/28/2022   VLDL 15 03/28/2022   LDLCALC 57 03/28/2022    Physical Findings: AIMS: Facial and Oral Movements Muscles of Facial Expression: None, normal Lips and Perioral Area: None, normal Jaw: None, normal Tongue: None, normal,Extremity Movements Upper (arms, wrists, hands, fingers): None, normal Lower (legs, knees, ankles, toes): None, normal, Trunk Movements Neck, shoulders, hips: None, normal, Overall Severity Severity of abnormal movements (highest score from questions above): None, normal Incapacitation due to abnormal movements: None, normal Patient's awareness of abnormal movements (rate only patient's report): No Awareness, Dental Status Current problems with teeth and/or dentures?: No Does patient usually wear dentures?: No  CIWA:    COWS:     Musculoskeletal: Strength & Muscle Tone: within normal limits Gait & Station: normal Patient leans: N/A  Psychiatric Specialty Exam:  Presentation  General Appearance: Bizarre; Disheveled  Eye Contact:Absent  Speech:Garbled  Speech Volume:Decreased  Handedness:Left   Mood and Affect  Mood:Dysphoric  Affect:Blunt; Inappropriate   Thought Process  Thought Processes:Disorganized; Irrevelant  Descriptions of Associations:Loose  Orientation:-- (UTA)  Thought Content:Illogical; Delusions  History of  Schizophrenia/Schizoaffective disorder:Yes  Duration of Psychotic Symptoms:Greater than six months  Hallucinations:No data recorded Ideas of Reference:Delusions; Paranoia  Suicidal Thoughts:No data recorded Homicidal Thoughts:No data recorded  Sensorium  Memory:-- (WILL NOT RESPOND TO QUESTIONS)  Judgment:Impaired  Insight:None   Executive Functions  Concentration:Poor  Attention Span:Poor  Recall:Poor  Fund of Knowledge:Poor  Language:Poor   Psychomotor Activity  Psychomotor Activity:No data recorded  Assets  Assets:Financial Resources/Insurance   Sleep  Sleep:No data recorded   Physical Exam: Physical Exam Vitals and nursing note reviewed.  HENT:  Head: Normocephalic and atraumatic.     Mouth/Throat:     Pharynx: Oropharynx is clear.  Eyes:     Pupils: Pupils are equal, round, and reactive to light.  Cardiovascular:     Rate and Rhythm: Normal rate and regular rhythm.  Pulmonary:     Effort: Pulmonary effort is normal.     Breath sounds: Normal breath sounds.  Abdominal:     General: Abdomen is flat.     Palpations: Abdomen is soft.  Musculoskeletal:        General: Normal range of motion.  Skin:    General: Skin is warm and dry.  Neurological:     General: No focal deficit present.     Mental Status: He is alert. Mental status is at baseline.  Psychiatric:        Attention and Perception: He is inattentive.        Mood and Affect: Mood normal. Affect is blunt and inappropriate.        Speech: He is noncommunicative.        Thought Content: Thought content is paranoid.        Cognition and Memory: Cognition is impaired.    Review of Systems  Unable to perform ROS: Psychiatric disorder   Blood pressure 108/67, pulse (!) 101, temperature 98.3 F (36.8 C), temperature source Oral, resp. rate 20, height 5\' 7"  (1.702 m), weight 65.8 kg, SpO2 100 %. Body mass index is 22.72 kg/m.   Treatment Plan Summary: Medication management and Plan  increase Haldol to 10 mg twice a day.  Keep trying to approach patient to form some kind of rapport or get some information so that we can start working on discharge planning.  , MD 04/04/2022, 4:20 PM

## 2022-04-04 NOTE — Plan of Care (Signed)
Patient refuse to come to med room for morning medicine but took it when taken to his room with no fussing. Patient guarded and does not makes a logical conversation.Patient mumbles for any questions. Appears with blunted affect but irritability noted noted. Patient hygiene is very poor. Change of cloths given and multiple prompting done. Patient just ignored it. Appetite and energy level good. Support and encouragement given.

## 2022-04-04 NOTE — [facility] Group Notes (Signed)
[facility] Group Notes:  (Nursing/MHT/Case Management/Adjunct)  Date:  04/04/2022  Time:  9:54 AM  Type of Therapy:   community meeting  Participation Level:  Did Not Attend    Rodena Goldmann 04/04/2022, 9:54 AM

## 2022-04-05 DIAGNOSIS — F200 Paranoid schizophrenia: Secondary | ICD-10-CM | POA: Diagnosis not present

## 2022-04-05 NOTE — Progress Notes (Signed)
[facility] MD Progress Note  04/05/2022 1:48 PM CHOSEN GESKE  MRN:  [id number] Subjective: Danny Blackwell is seen on rounds.  He has no complaints.  He is taking his medication as prescribed denies any side effects.  No issues. Principal Problem: Schizophrenia, paranoid (HCC) Diagnosis: Principal Problem:   Schizophrenia, paranoid (HCC)  Total Time spent with patient: 15 minutes  Past Psychiatric History: Unclear except that we know he has had at least 1 previous hospitalization with a diagnosis of schizophrenia  Past Medical History: History reviewed. No pertinent past medical history. History reviewed. No pertinent surgical history. Family History: History reviewed. No pertinent family history.  Social History:  Social History   Substance and Sexual Activity  Alcohol Use None     Social History   Substance and Sexual Activity  Drug Use Not on file    Social History   Socioeconomic History   Marital status: Unknown    Spouse name: Not on file   Number of children: Not on file   Years of education: Not on file   Highest education level: Not on file  Occupational History   Not on file  Tobacco Use   Smoking status: Every Day    Packs/day: 1.00    Years: 15.00    Total pack years: 15.00    Types: Cigarettes   Smokeless tobacco: Never  Vaping Use   Vaping Use: Unknown  Substance and Sexual Activity   Alcohol use: Not on file   Drug use: Not on file   Sexual activity: Not on file  Other Topics Concern   Not on file  Social History Narrative   ** Merged History Encounter **       Social Determinants of Health   Financial Resource Strain: Not on file  Food Insecurity: Not on file  Transportation Needs: Not on file  Physical Activity: Not on file  Stress: Not on file  Social Connections: Not on file   Additional Social History:                         Sleep: Good  Appetite:  Good  Current Medications: Current Facility-Administered Medications  Medication  Dose Route Frequency Provider Last Rate Last Admin   acetaminophen (TYLENOL) tablet 650 mg  650 mg Oral Q6H PRN Vanetta Mulders, NP       alum & mag hydroxide-simeth (MAALOX/MYLANTA) 200-200-20 MG/5ML suspension 30 mL  30 mL Oral Q4H PRN Gabriel Cirri F, NP   30 mL at 04/04/22 2215   clonazePAM (KLONOPIN) tablet 0.5 mg  0.5 mg Oral BID Charm Rings, NP   0.5 mg at 04/05/22 4580   diphenhydrAMINE (BENADRYL) injection 50 mg  50 mg Intravenous Q6H PRN Clapacs, John T, MD       feeding supplement (ENSURE ENLIVE / ENSURE PLUS) liquid 237 mL  237 mL Oral BID BM Clapacs, John T, MD   237 mL at 04/05/22 1030   ferrous sulfate tablet 325 mg  325 mg Oral Q breakfast Clapacs, Jackquline Denmark, MD   325 mg at 04/05/22 0851   haloperidol (HALDOL) tablet 10 mg  10 mg Oral BID Clapacs, Jackquline Denmark, MD   10 mg at 04/05/22 9983   Or   haloperidol lactate (HALDOL) injection 7.5 mg  7.5 mg Intramuscular BID Clapacs, Jackquline Denmark, MD       hydrOXYzine (ATARAX) tablet 25 mg  25 mg Oral TID PRN Vanetta Mulders, NP   25  mg at 03/31/22 1106   ibuprofen (ADVIL) tablet 600 mg  600 mg Oral Q6H PRN Clapacs, Jackquline Denmark, MD       loperamide (IMODIUM) capsule 2 mg  2 mg Oral PRN Clapacs, Jackquline Denmark, MD       LORazepam (ATIVAN) tablet 2 mg  2 mg Oral Q6H PRN Clapacs, John T, MD   2 mg at 04/04/22 2134   Or   LORazepam (ATIVAN) injection 2 mg  2 mg Intramuscular Q6H PRN Clapacs, John T, MD       magnesium hydroxide (MILK OF MAGNESIA) suspension 30 mL  30 mL Oral Daily PRN Gabriel Cirri F, NP       pantoprazole (PROTONIX) EC tablet 40 mg  40 mg Oral BID Clapacs, John T, MD   40 mg at 04/05/22 0850   sucralfate (CARAFATE) tablet 1 g  1 g Oral TID WC & HS Charm Rings, NP   1 g at 04/05/22 1235   ziprasidone (GEODON) injection 20 mg  20 mg Intramuscular Q12H PRN Clapacs, Jackquline Denmark, MD   20 mg at 03/31/22 1117    Lab Results: No results found for this or any previous visit (from the past 48 hour(s)).  Blood Alcohol level:  Lab Results   Component Value Date   ETH <10 03/25/2022   ETH <10 10/11/2021    Metabolic Disorder Labs: Lab Results  Component Value Date   HGBA1C 5.0 03/28/2022   MPG 96.8 03/28/2022   No results found for: "PROLACTIN" Lab Results  Component Value Date   CHOL 116 03/28/2022   TRIG 76 03/28/2022   HDL 44 03/28/2022   CHOLHDL 2.6 03/28/2022   VLDL 15 03/28/2022   LDLCALC 57 03/28/2022    Physical Findings: AIMS: Facial and Oral Movements Muscles of Facial Expression: None, normal Lips and Perioral Area: None, normal Jaw: None, normal Tongue: None, normal,Extremity Movements Upper (arms, wrists, hands, fingers): None, normal Lower (legs, knees, ankles, toes): None, normal, Trunk Movements Neck, shoulders, hips: None, normal, Overall Severity Severity of abnormal movements (highest score from questions above): None, normal Incapacitation due to abnormal movements: None, normal Patient's awareness of abnormal movements (rate only patient's report): No Awareness, Dental Status Current problems with teeth and/or dentures?: No Does patient usually wear dentures?: No  CIWA:    COWS:     Musculoskeletal: Strength & Muscle Tone: within normal limits Gait & Station: normal Patient leans: N/A  Psychiatric Specialty Exam:  Presentation  General Appearance: Bizarre; Disheveled  Eye Contact:Absent  Speech:Garbled  Speech Volume:Decreased  Handedness:Left   Mood and Affect  Mood:Dysphoric  Affect:Blunt; Inappropriate   Thought Process  Thought Processes:Disorganized; Irrevelant  Descriptions of Associations:Loose  Orientation:-- (UTA)  Thought Content:Illogical; Delusions  History of Schizophrenia/Schizoaffective disorder:Yes  Duration of Psychotic Symptoms:Greater than six months  Hallucinations:No data recorded Ideas of Reference:Delusions; Paranoia  Suicidal Thoughts:No data recorded Homicidal Thoughts:No data recorded  Sensorium  Memory:-- (WILL NOT  RESPOND TO QUESTIONS)  Judgment:Impaired  Insight:None   Executive Functions  Concentration:Poor  Attention Span:Poor  Recall:Poor  Fund of Knowledge:Poor  Language:Poor   Psychomotor Activity  Psychomotor Activity:No data recorded  Assets  Assets:Financial Resources/Insurance   Sleep  Sleep:No data recorded   Blood pressure 114/62, pulse (!) 110, temperature 99.2 F (37.3 C), temperature source Oral, resp. rate 18, height 5\' 7"  (1.702 m), weight 65.8 kg, SpO2 98 %. Body mass index is 22.72 kg/m.   Treatment Plan Summary: Daily contact with patient to assess and evaluate symptoms  and progress in treatment, Medication management, and Plan continue current medications.  Dewell Monnier Tresea Mall, DO 04/05/2022, 1:48 PM

## 2022-04-05 NOTE — Plan of Care (Signed)
D- Patient alert and oriented to person and situation. Patient presented in a preoccupied, but pleasant mood on assessment. Patient has engaged in a little more conversation with this Clinical research associate today, only because he knew he was getting his Ensure, after asking for it. Any other time, all patient does is mumble. Patient stated that he slept "good" last night and had no complaints to voice to this Clinical research associate. Patient denies SI, HI, AVH, and pain at this time. Patient also denied any signs/symptoms of depression and anxiety, stating to this writer "no, I don't have any of that, just nerve damage". Patient had no stated goals for today.  A- Scheduled medications administered to patient, per MD orders. Support and encouragement provided.  Routine safety checks conducted every 15 minutes.  Patient informed to notify staff with problems or concerns.  R- No adverse drug reactions noted. Patient contracts for safety at this time. Patient compliant with medications. Patient receptive, calm, and cooperative. Patient remains safe at this time.  Problem: Education: Goal: Knowledge of General Education information will improve Description: Including pain rating scale, medication(s)/side effects and non-pharmacologic comfort measures Outcome: Progressing   Problem: Health Behavior/Discharge Planning: Goal: Ability to manage health-related needs will improve Outcome: Progressing   Problem: Clinical Measurements: Goal: Ability to maintain clinical measurements within normal limits will improve Outcome: Progressing Goal: Will remain free from infection Outcome: Progressing Goal: Diagnostic test results will improve Outcome: Progressing Goal: Respiratory complications will improve Outcome: Progressing Goal: Cardiovascular complication will be avoided Outcome: Progressing   Problem: Activity: Goal: Risk for activity intolerance will decrease Outcome: Progressing   Problem: Nutrition: Goal: Adequate nutrition  will be maintained Outcome: Progressing   Problem: Coping: Goal: Level of anxiety will decrease Outcome: Progressing   Problem: Elimination: Goal: Will not experience complications related to bowel motility Outcome: Progressing Goal: Will not experience complications related to urinary retention Outcome: Progressing   Problem: Pain Managment: Goal: General experience of comfort will improve Outcome: Progressing   Problem: Safety: Goal: Ability to remain free from injury will improve Outcome: Progressing   Problem: Skin Integrity: Goal: Risk for impaired skin integrity will decrease Outcome: Progressing

## 2022-04-05 NOTE — Progress Notes (Signed)
Patient alert and oriented x 2 with periods of confusion to place and situation, he appears responding to internal stimuli affect is blunted thoughts are disorganized with loose association and tangential speech. Patient was not interacting appropriately with peers and staff, he was oblivious passing gas on the unit and in the  day room which was offensive to the peers and subsequently he was asked to leave the dayroom to avoid altercation. Patient complained of abdominal pain but would not answer when he last had bowel movement and would not allow writer to touch or listen for bowel sounds, however he agreed to take medicine for flatulence. 15 minutes safety checks maintained will continue to monitor

## 2022-04-06 DIAGNOSIS — F200 Paranoid schizophrenia: Secondary | ICD-10-CM | POA: Diagnosis not present

## 2022-04-06 NOTE — Plan of Care (Signed)
  Problem: Education: Goal: Knowledge of General Education information will improve Description: Including pain rating scale, medication(s)/side effects and non-pharmacologic comfort measures Outcome: Progressing   Problem: Health Behavior/Discharge Planning: Goal: Ability to manage health-related needs will improve Outcome: Progressing   Problem: Clinical Measurements: Goal: Ability to maintain clinical measurements within normal limits will improve Outcome: Progressing Goal: Will remain free from infection Outcome: Progressing Goal: Diagnostic test results will improve Outcome: Progressing Goal: Respiratory complications will improve Outcome: Progressing Goal: Cardiovascular complication will be avoided Outcome: Progressing   Problem: Skin Integrity: Goal: Risk for impaired skin integrity will decrease Outcome: Progressing   Problem: Safety: Goal: Ability to remain free from injury will improve Outcome: Progressing   Problem: Elimination: Goal: Will not experience complications related to bowel motility Outcome: Progressing Goal: Will not experience complications related to urinary retention Outcome: Progressing   Problem: Coping: Goal: Level of anxiety will decrease Outcome: Progressing   Problem: Nutrition: Goal: Adequate nutrition will be maintained Outcome: Progressing   Problem: Activity: Goal: Risk for activity intolerance will decrease Outcome: Progressing

## 2022-04-06 NOTE — Group Note (Signed)
LCSW Group Therapy Note  Group Date: 04/06/2022 Start Time: 1300 End Time: 1400   Type of Therapy and Topic:  Group Therapy - How To Cope with Nervousness about Discharge   Participation Level:  Did Not Attend   Description of Group This process group involved identification of patients' feelings about discharge. Some of them are scheduled to be discharged soon, while others are new admissions, but each of them was asked to share thoughts and feelings surrounding discharge from the hospital. One common theme was that they are excited at the prospect of going home, while another was that many of them are apprehensive about sharing why they were hospitalized. Patients were given the opportunity to discuss these feelings with their peers in preparation for discharge.  Therapeutic Goals  Patient will identify their overall feelings about pending discharge. Patient will think about how they might proactively address issues that they believe will once again arise once they get home (i.e. with parents). Patients will participate in discussion about having hope for change.   Summary of Patient Progress:   X   Therapeutic Modalities Cognitive Behavioral Therapy   Glenis Smoker, LCSW 04/06/2022  1:41 PM

## 2022-04-06 NOTE — Plan of Care (Signed)
D- Patient alert and oriented to person and situation. Patient presented in a preoccupied, but pleasant mood on assessment.  Patient has been sleeping on and off throughout the day, but, he does come out for meals. Patient has to be prompted to take medication, but he is taking them quicker than he has in the previous days. This writer is still unable to fully assess if patient is experiencing SI, HI, AVH, and pain, however, he does not appear to be in any acute pain nor distress. Patient has not voiced if he is experiencing any signs/symptoms of depression/anxiety, he only spoke to this writer when asking for an Ensure or coffee creamer. Patient had no stated goals for today.  A- Scheduled medications administered to patient, per MD orders. Support and encouragement provided.  Routine safety checks conducted every 15 minutes.  Patient informed to notify staff with problems or concerns.  R- No adverse drug reactions noted. Patient compliant with medications. Patient receptive, calm, and cooperative. Patient remains safe at this time.  Problem: Education: Goal: Knowledge of General Education information will improve Description: Including pain rating scale, medication(s)/side effects and non-pharmacologic comfort measures Outcome: Progressing   Problem: Health Behavior/Discharge Planning: Goal: Ability to manage health-related needs will improve Outcome: Progressing   Problem: Clinical Measurements: Goal: Ability to maintain clinical measurements within normal limits will improve Outcome: Progressing Goal: Will remain free from infection Outcome: Progressing Goal: Diagnostic test results will improve Outcome: Progressing Goal: Respiratory complications will improve Outcome: Progressing Goal: Cardiovascular complication will be avoided Outcome: Progressing   Problem: Activity: Goal: Risk for activity intolerance will decrease Outcome: Progressing   Problem: Nutrition: Goal: Adequate  nutrition will be maintained Outcome: Progressing   Problem: Coping: Goal: Level of anxiety will decrease Outcome: Progressing   Problem: Elimination: Goal: Will not experience complications related to bowel motility Outcome: Progressing Goal: Will not experience complications related to urinary retention Outcome: Progressing   Problem: Pain Managment: Goal: General experience of comfort will improve Outcome: Progressing   Problem: Safety: Goal: Ability to remain free from injury will improve Outcome: Progressing   Problem: Skin Integrity: Goal: Risk for impaired skin integrity will decrease Outcome: Progressing

## 2022-04-06 NOTE — [facility] Group Notes (Signed)
[facility] Group Notes:  (Nursing/MHT/Case Management/Adjunct)  Date:  04/06/2022  Time:  10:21 AM  Type of Therapy:   community meeting  Participation Level:  Did Not Attend    Rodena Goldmann 04/06/2022, 10:21 AM

## 2022-04-06 NOTE — Progress Notes (Signed)
Danny Blackwell remains isolative to himself as he paces  the halls and sits in the dayroom amongst his peers.  He is upset this evening that he cannot have another Ensure drink, as it was explained that he gets hem twice daily.  He declined the offer for a juice stating that he needs either an Ensure or a Coca Cola or he is going to get sick. He is med compliant and took his meds without incident. Remains safe on the unit with q 15 minute safety checks.       C Butler-Nicholson, LPN

## 2022-04-06 NOTE — Progress Notes (Signed)
[facility] MD Progress Note  04/06/2022 4:22 PM Danny Blackwell  MRN:  [id number] Subjective: Danny Blackwell is seen on rounds.  He has been compliant with his medications.  He has been a little tired.  No complaints.  No issues.  No side effects.  Principal Problem: Schizophrenia, paranoid (HCC) Diagnosis: Principal Problem:   Schizophrenia, paranoid (HCC)  Total Time spent with patient: 15 minutes  Past Psychiatric History: Unclear except that we know he has had at least 1 previous hospitalization with a diagnosis of schizophrenia  Past Medical History: History reviewed. No pertinent past medical history. History reviewed. No pertinent surgical history. Family History: History reviewed. No pertinent family history.  Social History:  Social History   Substance and Sexual Activity  Alcohol Use None     Social History   Substance and Sexual Activity  Drug Use Not on file    Social History   Socioeconomic History   Marital status: Unknown    Spouse name: Not on file   Number of children: Not on file   Years of education: Not on file   Highest education level: Not on file  Occupational History   Not on file  Tobacco Use   Smoking status: Every Day    Packs/day: 1.00    Years: 15.00    Total pack years: 15.00    Types: Cigarettes   Smokeless tobacco: Never  Vaping Use   Vaping Use: Unknown  Substance and Sexual Activity   Alcohol use: Not on file   Drug use: Not on file   Sexual activity: Not on file  Other Topics Concern   Not on file  Social History Narrative   ** Merged History Encounter **       Social Determinants of Health   Financial Resource Strain: Not on file  Food Insecurity: Not on file  Transportation Needs: Not on file  Physical Activity: Not on file  Stress: Not on file  Social Connections: Not on file   Additional Social History:                         Sleep: Good  Appetite:  Good  Current Medications: Current Facility-Administered  Medications  Medication Dose Route Frequency Provider Last Rate Last Admin   acetaminophen (TYLENOL) tablet 650 mg  650 mg Oral Q6H PRN Vanetta Mulders, NP       alum & mag hydroxide-simeth (MAALOX/MYLANTA) 200-200-20 MG/5ML suspension 30 mL  30 mL Oral Q4H PRN Gabriel Cirri F, NP   30 mL at 04/04/22 2215   clonazePAM (KLONOPIN) tablet 0.5 mg  0.5 mg Oral BID Charm Rings, NP   0.5 mg at 04/06/22 0258   diphenhydrAMINE (BENADRYL) injection 50 mg  50 mg Intravenous Q6H PRN Clapacs, John T, MD       feeding supplement (ENSURE ENLIVE / ENSURE PLUS) liquid 237 mL  237 mL Oral BID BM Clapacs, John T, MD   237 mL at 04/06/22 1408   ferrous sulfate tablet 325 mg  325 mg Oral Q breakfast Clapacs, Jackquline Denmark, MD   325 mg at 04/06/22 5277   haloperidol (HALDOL) tablet 10 mg  10 mg Oral BID Clapacs, Jackquline Denmark, MD   10 mg at 04/06/22 8242   Or   haloperidol lactate (HALDOL) injection 7.5 mg  7.5 mg Intramuscular BID Clapacs, Jackquline Denmark, MD       hydrOXYzine (ATARAX) tablet 25 mg  25 mg Oral TID PRN Barthold,  Nickola MajorLouise F, NP   25 mg at 04/05/22 2138   ibuprofen (ADVIL) tablet 600 mg  600 mg Oral Q6H PRN Clapacs, Jackquline DenmarkJohn T, MD       loperamide (IMODIUM) capsule 2 mg  2 mg Oral PRN Clapacs, Jackquline DenmarkJohn T, MD       LORazepam (ATIVAN) tablet 2 mg  2 mg Oral Q6H PRN Clapacs, John T, MD   2 mg at 04/04/22 2134   Or   LORazepam (ATIVAN) injection 2 mg  2 mg Intramuscular Q6H PRN Clapacs, John T, MD       magnesium hydroxide (MILK OF MAGNESIA) suspension 30 mL  30 mL Oral Daily PRN Gabriel CirriBarthold, Louise F, NP       pantoprazole (PROTONIX) EC tablet 40 mg  40 mg Oral BID Clapacs, John T, MD   40 mg at 04/06/22 0824   sucralfate (CARAFATE) tablet 1 g  1 g Oral TID WC & HS Charm RingsLord, Jamison Y, NP   1 g at 04/06/22 1214   ziprasidone (GEODON) injection 20 mg  20 mg Intramuscular Q12H PRN Clapacs, Jackquline DenmarkJohn T, MD   20 mg at 03/31/22 1117    Lab Results: No results found for this or any previous visit (from the past 48 hour(s)).  Blood Alcohol  level:  Lab Results  Component Value Date   ETH <10 03/25/2022   ETH <10 10/11/2021    Metabolic Disorder Labs: Lab Results  Component Value Date   HGBA1C 5.0 03/28/2022   MPG 96.8 03/28/2022   No results found for: "PROLACTIN" Lab Results  Component Value Date   CHOL 116 03/28/2022   TRIG 76 03/28/2022   HDL 44 03/28/2022   CHOLHDL 2.6 03/28/2022   VLDL 15 03/28/2022   LDLCALC 57 03/28/2022    Physical Findings: AIMS: Facial and Oral Movements Muscles of Facial Expression: None, normal Lips and Perioral Area: None, normal Jaw: None, normal Tongue: None, normal,Extremity Movements Upper (arms, wrists, hands, fingers): None, normal Lower (legs, knees, ankles, toes): None, normal, Trunk Movements Neck, shoulders, hips: None, normal, Overall Severity Severity of abnormal movements (highest score from questions above): None, normal Incapacitation due to abnormal movements: None, normal Patient's awareness of abnormal movements (rate only patient's report): No Awareness, Dental Status Current problems with teeth and/or dentures?: No Does patient usually wear dentures?: No  CIWA:    COWS:     Musculoskeletal: Strength & Muscle Tone: within normal limits Gait & Station: normal Patient leans: N/A  Psychiatric Specialty Exam:  Presentation  General Appearance: Bizarre; Disheveled  Eye Contact:Absent  Speech:Garbled  Speech Volume:Decreased  Handedness:Left   Mood and Affect  Mood:Dysphoric  Affect:Blunt; Inappropriate   Thought Process  Thought Processes:Disorganized; Irrevelant  Descriptions of Associations:Loose  Orientation:-- (UTA)  Thought Content:Illogical; Delusions  History of Schizophrenia/Schizoaffective disorder:Yes  Duration of Psychotic Symptoms:Greater than six months  Hallucinations:No data recorded Ideas of Reference:Delusions; Paranoia  Suicidal Thoughts:No data recorded Homicidal Thoughts:No data recorded  Sensorium   Memory:-- (WILL NOT RESPOND TO QUESTIONS)  Judgment:Impaired  Insight:None   Executive Functions  Concentration:Poor  Attention Span:Poor  Recall:Poor  Fund of Knowledge:Poor  Language:Poor   Psychomotor Activity  Psychomotor Activity:No data recorded  Assets  Assets:Financial Resources/Insurance   Sleep  Sleep:No data recorded    Blood pressure 114/62, pulse (!) 110, temperature 99.2 F (37.3 C), temperature source Oral, resp. rate 18, height 5\' 7"  (1.702 m), weight 65.8 kg, SpO2 98 %. Body mass index is 22.72 kg/m.   Treatment Plan Summary: Daily contact  with patient to assess and evaluate symptoms and progress in treatment, Medication management, and Plan continue current medications.  Sarina Ill, DO 04/06/2022, 4:22 PM

## 2022-04-07 DIAGNOSIS — F203 Undifferentiated schizophrenia: Secondary | ICD-10-CM | POA: Diagnosis not present

## 2022-04-07 MED ORDER — OLANZAPINE 10 MG PO TBDP
10.0000 mg | ORAL_TABLET | Freq: Two times a day (BID) | ORAL | Status: DC
  Administered 2022-04-07 – 2022-04-09 (×4): 10 mg via ORAL
  Filled 2022-04-07 (×4): qty 1

## 2022-04-07 NOTE — Plan of Care (Signed)
Patient not able to make a conversation with staff.Patient appears with very poor hygiene and a preoccupied mind. When called for medicine patient mumble with " F " word.  But patient compliant with medication when taken to him. Patient in & out of his room,pacing in the hallway. No issues verbalized and no distress noted. Appetite and energy level good. Support and encouragement given.

## 2022-04-07 NOTE — Progress Notes (Signed)
[facility] MD Progress Note  04/07/2022 12:08 PM Danny Blackwell  MRN:  [id number] Subjective: Follow-up 51 year old man with schizophrenia.  Attempted to sit down and have a conversation calmly with the patient reassuring him that I had no interest in keeping him prisoner and wanted to discuss anything about discharge planning.  Patient remains very sick however.  Really could not cooperate at all.  Will not engage in conversation or answer direct questions just paces back and forth muttering to himself.  Smells like he has not done any personal hygiene since coming into the hospital.  This despite being on the Haldol. Principal Problem: Schizophrenia, paranoid (HCC) Diagnosis: Principal Problem:   Schizophrenia, paranoid (HCC)  Total Time spent with patient: 30 minutes  Past Psychiatric History: Past history of schizophrenia  Past Medical History: History reviewed. No pertinent past medical history. History reviewed. No pertinent surgical history. Family History: History reviewed. No pertinent family history. Family Psychiatric  History: See previous Social History:  Social History   Substance and Sexual Activity  Alcohol Use None     Social History   Substance and Sexual Activity  Drug Use Not on file    Social History   Socioeconomic History   Marital status: Unknown    Spouse name: Not on file   Number of children: Not on file   Years of education: Not on file   Highest education level: Not on file  Occupational History   Not on file  Tobacco Use   Smoking status: Every Day    Packs/day: 1.00    Years: 15.00    Total pack years: 15.00    Types: Cigarettes   Smokeless tobacco: Never  Vaping Use   Vaping Use: Unknown  Substance and Sexual Activity   Alcohol use: Not on file   Drug use: Not on file   Sexual activity: Not on file  Other Topics Concern   Not on file  Social History Narrative   ** Merged History Encounter **       Social Determinants of Health    Financial Resource Strain: Not on file  Food Insecurity: Not on file  Transportation Needs: Not on file  Physical Activity: Not on file  Stress: Not on file  Social Connections: Not on file   Additional Social History:                         Sleep: Fair  Appetite:  Fair  Current Medications: Current Facility-Administered Medications  Medication Dose Route Frequency Provider Last Rate Last Admin   acetaminophen (TYLENOL) tablet 650 mg  650 mg Oral Q6H PRN Vanetta Mulders, NP       alum & mag hydroxide-simeth (MAALOX/MYLANTA) 200-200-20 MG/5ML suspension 30 mL  30 mL Oral Q4H PRN Gabriel Cirri F, NP   30 mL at 04/04/22 2215   clonazePAM (KLONOPIN) tablet 0.5 mg  0.5 mg Oral BID Charm Rings, NP   0.5 mg at 04/07/22 0759   diphenhydrAMINE (BENADRYL) injection 50 mg  50 mg Intravenous Q6H PRN Braxxton Stoudt T, MD       feeding supplement (ENSURE ENLIVE / ENSURE PLUS) liquid 237 mL  237 mL Oral BID BM Dayona Shaheen T, MD   237 mL at 04/06/22 1408   ferrous sulfate tablet 325 mg  325 mg Oral Q breakfast Yaminah Clayborn, Jackquline Denmark, MD   325 mg at 04/07/22 0759   hydrOXYzine (ATARAX) tablet 25 mg  25 mg Oral  TID PRN Vanetta Mulders, NP   25 mg at 04/06/22 2112   ibuprofen (ADVIL) tablet 600 mg  600 mg Oral Q6H PRN Jencarlos Nicolson, Jackquline Denmark, MD       loperamide (IMODIUM) capsule 2 mg  2 mg Oral PRN Valeska Haislip, Jackquline Denmark, MD       LORazepam (ATIVAN) tablet 2 mg  2 mg Oral Q6H PRN Terilynn Buresh, Jackquline Denmark, MD   2 mg at 04/04/22 2134   Or   LORazepam (ATIVAN) injection 2 mg  2 mg Intramuscular Q6H PRN Cloee Dunwoody, Jackquline Denmark, MD       magnesium hydroxide (MILK OF MAGNESIA) suspension 30 mL  30 mL Oral Daily PRN Gabriel Cirri F, NP       OLANZapine zydis (ZYPREXA) disintegrating tablet 10 mg  10 mg Oral BID Maisey Deandrade T, MD       pantoprazole (PROTONIX) EC tablet 40 mg  40 mg Oral BID Lari Linson T, MD   40 mg at 04/07/22 0759   sucralfate (CARAFATE) tablet 1 g  1 g Oral TID WC & HS Charm Rings, NP   1  g at 04/07/22 0759   ziprasidone (GEODON) injection 20 mg  20 mg Intramuscular Q12H PRN Ricke Kimoto, Jackquline Denmark, MD   20 mg at 03/31/22 1117    Lab Results: No results found for this or any previous visit (from the past 48 hour(s)).  Blood Alcohol level:  Lab Results  Component Value Date   ETH <10 03/25/2022   ETH <10 10/11/2021    Metabolic Disorder Labs: Lab Results  Component Value Date   HGBA1C 5.0 03/28/2022   MPG 96.8 03/28/2022   No results found for: "PROLACTIN" Lab Results  Component Value Date   CHOL 116 03/28/2022   TRIG 76 03/28/2022   HDL 44 03/28/2022   CHOLHDL 2.6 03/28/2022   VLDL 15 03/28/2022   LDLCALC 57 03/28/2022    Physical Findings: AIMS: Facial and Oral Movements Muscles of Facial Expression: None, normal Lips and Perioral Area: None, normal Jaw: None, normal Tongue: None, normal,Extremity Movements Upper (arms, wrists, hands, fingers): None, normal Lower (legs, knees, ankles, toes): None, normal, Trunk Movements Neck, shoulders, hips: None, normal, Overall Severity Severity of abnormal movements (highest score from questions above): None, normal Incapacitation due to abnormal movements: None, normal Patient's awareness of abnormal movements (rate only patient's report): No Awareness, Dental Status Current problems with teeth and/or dentures?: No Does patient usually wear dentures?: No  CIWA:    COWS:     Musculoskeletal: Strength & Muscle Tone: within normal limits Gait & Station: normal Patient leans: N/A  Psychiatric Specialty Exam:  Presentation  General Appearance: Bizarre; Disheveled  Eye Contact:Absent  Speech:Garbled  Speech Volume:Decreased  Handedness:Left   Mood and Affect  Mood:Dysphoric  Affect:Blunt; Inappropriate   Thought Process  Thought Processes:Disorganized; Irrevelant  Descriptions of Associations:Loose  Orientation:-- (UTA)  Thought Content:Illogical; Delusions  History of  Schizophrenia/Schizoaffective disorder:Yes  Duration of Psychotic Symptoms:Greater than six months  Hallucinations:No data recorded Ideas of Reference:Delusions; Paranoia  Suicidal Thoughts:No data recorded Homicidal Thoughts:No data recorded  Sensorium  Memory:-- (WILL NOT RESPOND TO QUESTIONS)  Judgment:Impaired  Insight:None   Executive Functions  Concentration:Poor  Attention Span:Poor  Recall:Poor  Fund of Knowledge:Poor  Language:Poor   Psychomotor Activity  Psychomotor Activity:No data recorded  Assets  Assets:Financial Resources/Insurance   Sleep  Sleep:No data recorded   Physical Exam: Physical Exam Vitals and nursing note reviewed.  Constitutional:      Appearance:  Normal appearance.  HENT:     Head: Normocephalic and atraumatic.     Mouth/Throat:     Pharynx: Oropharynx is clear.  Eyes:     Pupils: Pupils are equal, round, and reactive to light.  Cardiovascular:     Rate and Rhythm: Normal rate and regular rhythm.  Pulmonary:     Effort: Pulmonary effort is normal.     Breath sounds: Normal breath sounds.  Abdominal:     General: Abdomen is flat.     Palpations: Abdomen is soft.  Musculoskeletal:        General: Normal range of motion.  Skin:    General: Skin is warm and dry.  Neurological:     General: No focal deficit present.     Mental Status: He is alert. Mental status is at baseline.  Psychiatric:        Attention and Perception: He is inattentive.        Mood and Affect: Affect is blunt and angry.        Speech: He is noncommunicative.        Behavior: Behavior is agitated. Behavior is not aggressive.        Thought Content: Thought content is paranoid.    Review of Systems  Unable to perform ROS: Psychiatric disorder   Blood pressure 118/73, pulse (!) 125, temperature 99.2 F (37.3 C), temperature source Oral, resp. rate 18, height 5\' 7"  (1.702 m), weight 65.8 kg, SpO2 98 %. Body mass index is 22.72  kg/m.   Treatment Plan Summary: Plan the only old records we have a note from [facility] which has little information except that they did have him on olanzapine.  Since he does not seem much better with the Haldol and might be having akathisia anyway I will switch him off of that and back to olanzapine 10 mg twice a day in the hope that eventually he will get lucid enough that we can have a conversation.  [facility], MD 04/07/2022, 12:08 PM

## 2022-04-07 NOTE — BH IP Treatment Plan (Signed)
Interdisciplinary Treatment and Diagnostic Plan Update  04/07/2022 Time of Session: 8:30AM Danny Blackwell MRN: [id number]  Principal Diagnosis: Schizophrenia, paranoid (HCC)  Secondary Diagnoses: Principal Problem:   Schizophrenia, paranoid (HCC)   Current Medications:  Current Facility-Administered Medications  Medication Dose Route Frequency Provider Last Rate Last Admin   acetaminophen (TYLENOL) tablet 650 mg  650 mg Oral Q6H PRN Vanetta Mulders, NP       alum & mag hydroxide-simeth (MAALOX/MYLANTA) 200-200-20 MG/5ML suspension 30 mL  30 mL Oral Q4H PRN Gabriel Cirri F, NP   30 mL at 04/04/22 2215   clonazePAM (KLONOPIN) tablet 0.5 mg  0.5 mg Oral BID Charm Rings, NP   0.5 mg at 04/07/22 0759   diphenhydrAMINE (BENADRYL) injection 50 mg  50 mg Intravenous Q6H PRN Clapacs, Jackquline Denmark, MD       feeding supplement (ENSURE ENLIVE / ENSURE PLUS) liquid 237 mL  237 mL Oral BID BM Clapacs, John T, MD   237 mL at 04/06/22 1408   ferrous sulfate tablet 325 mg  325 mg Oral Q breakfast Clapacs, Jackquline Denmark, MD   325 mg at 04/07/22 0759   haloperidol (HALDOL) tablet 10 mg  10 mg Oral BID Clapacs, John T, MD   10 mg at 04/07/22 3154   Or   haloperidol lactate (HALDOL) injection 7.5 mg  7.5 mg Intramuscular BID Clapacs, Jackquline Denmark, MD       hydrOXYzine (ATARAX) tablet 25 mg  25 mg Oral TID PRN Vanetta Mulders, NP   25 mg at 04/06/22 2112   ibuprofen (ADVIL) tablet 600 mg  600 mg Oral Q6H PRN Clapacs, Jackquline Denmark, MD       loperamide (IMODIUM) capsule 2 mg  2 mg Oral PRN Clapacs, Jackquline Denmark, MD       LORazepam (ATIVAN) tablet 2 mg  2 mg Oral Q6H PRN Clapacs, John T, MD   2 mg at 04/04/22 2134   Or   LORazepam (ATIVAN) injection 2 mg  2 mg Intramuscular Q6H PRN Clapacs, John T, MD       magnesium hydroxide (MILK OF MAGNESIA) suspension 30 mL  30 mL Oral Daily PRN Gabriel Cirri F, NP       pantoprazole (PROTONIX) EC tablet 40 mg  40 mg Oral BID Clapacs, John T, MD   40 mg at 04/07/22 0759   sucralfate  (CARAFATE) tablet 1 g  1 g Oral TID WC & HS Charm Rings, NP   1 g at 04/07/22 0759   ziprasidone (GEODON) injection 20 mg  20 mg Intramuscular Q12H PRN Clapacs, Jackquline Denmark, MD   20 mg at 03/31/22 1117   PTA Medications: Medications Prior to Admission  Medication Sig Dispense Refill Last Dose   OLANZapine (ZYPREXA) 2.5 MG tablet Take 1 tablet (2.5 mg total) by mouth 2 (two) times daily. (Patient not taking: Reported on 03/25/2022) 60 tablet 1     Patient Stressors: Legal issue   Medication change or noncompliance    Patient Strengths: Automotive engineer for treatment/growth   Treatment Modalities: Medication Management, Group therapy, Case management,  1 to 1 session with clinician, Psychoeducation, Recreational therapy.   Physician Treatment Plan for Primary Diagnosis: Schizophrenia, paranoid (HCC) Long Term Goal(s): Improvement in symptoms so as ready for discharge   Short Term Goals: Ability to maintain clinical measurements within normal limits will improve Compliance with prescribed medications will improve Ability to verbalize feelings will improve Ability to demonstrate self-control  will improve Ability to identify and develop effective coping behaviors will improve  Medication Management: Evaluate patient's response, side effects, and tolerance of medication regimen.  Therapeutic Interventions: 1 to 1 sessions, Unit Group sessions and Medication administration.  Evaluation of Outcomes: Not Progressing  Physician Treatment Plan for Secondary Diagnosis: Principal Problem:   Schizophrenia, paranoid (HCC)  Long Term Goal(s): Improvement in symptoms so as ready for discharge   Short Term Goals: Ability to maintain clinical measurements within normal limits will improve Compliance with prescribed medications will improve Ability to verbalize feelings will improve Ability to demonstrate self-control will improve Ability to identify and develop effective  coping behaviors will improve     Medication Management: Evaluate patient's response, side effects, and tolerance of medication regimen.  Therapeutic Interventions: 1 to 1 sessions, Unit Group sessions and Medication administration.  Evaluation of Outcomes: Not Progressing   RN Treatment Plan for Primary Diagnosis: Schizophrenia, paranoid (HCC) Long Term Goal(s): Knowledge of disease and therapeutic regimen to maintain health will improve  Short Term Goals: Ability to verbalize frustration and anger appropriately will improve, Ability to demonstrate self-control, Ability to participate in decision making will improve, Ability to verbalize feelings will improve, Ability to disclose and discuss suicidal ideas, Ability to identify and develop effective coping behaviors will improve, and Compliance with prescribed medications will improve  Medication Management: RN will administer medications as ordered by provider, will assess and evaluate patient's response and provide education to patient for prescribed medication. RN will report any adverse and/or side effects to prescribing provider.  Therapeutic Interventions: 1 on 1 counseling sessions, Psychoeducation, Medication administration, Evaluate responses to treatment, Monitor vital signs and CBGs as ordered, Perform/monitor CIWA, COWS, AIMS and Fall Risk screenings as ordered, Perform wound care treatments as ordered.  Evaluation of Outcomes: Not Progressing   LCSW Treatment Plan for Primary Diagnosis: Schizophrenia, paranoid (HCC) Long Term Goal(s): Safe transition to appropriate next level of care at discharge, Engage patient in therapeutic group addressing interpersonal concerns.  Short Term Goals: Engage patient in aftercare planning with referrals and resources, Increase social support, Increase ability to appropriately verbalize feelings, Increase emotional regulation, Facilitate acceptance of mental health diagnosis and concerns, and  Increase skills for wellness and recovery  Therapeutic Interventions: Assess for all discharge needs, 1 to 1 time with Social worker, Explore available resources and support systems, Assess for adequacy in community support network, Educate family and significant other(s) on suicide prevention, Complete Psychosocial Assessment, Interpersonal group therapy.  Evaluation of Outcomes: Not Progressing   Progress in Treatment: Attending groups: No. Participating in groups: No. Taking medication as prescribed: Yes. Toleration medication: Yes. Family/Significant other contact made: No, will contact:  once permission is given. Patient understands diagnosis: No. Discussing patient identified problems/goals with staff: No. Medical problems stabilized or resolved: Yes. Denies suicidal/homicidal ideation: No. Issues/concerns per patient self-inventory: No. Other: none  New problem(s) identified: No, Describe:  none  New problem(s) identified: No, Describe:  none identified. Update 04/02/22: No changes at this time.  Update 04/07/2022:  No changes at this time.    New Short Term/Long Term Goal(s): elimination of symptoms of psychosis, medication management for mood stabilization; elimination of SI thoughts; development of comprehensive mental wellness plan. Update 04/02/22: No changes at this time.  Update 04/07/2022:  No changes at this time.    Patient Goals:  Pt declined to participate in treatment team meeting despite individual extended invitation. Update 04/02/22: No changes at this time.  Update 04/07/2022:  No changes at this  time.    Discharge Plan or Barriers: CSW will assist pt with development of an appropriate aftercare/discharge plan. Update 04/02/22: No changes at this time.  Update 04/07/2022:  No changes at this time. Patient continues to remain disorganized and not communicating with staff.    Reason for Continuation of Hospitalization: Medication stabilization Other; describe psychosis    Estimated Length of Stay: 1-7 days Update 04/02/22: TBD  Last 3 [place] Suicide Severity Risk Score: Flowsheet Row Admission (Current) from 03/26/2022 in [facility] INPATIENT BEHAVIORAL MEDICINE ED from 03/25/2022 in [facility] EMERGENCY DEPARTMENT ED from 10/11/2021 in [facility] EMERGENCY DEPARTMENT  C-SSRS RISK CATEGORY No Risk No Risk No Risk       Last PHQ 2/9 Scores:     No data to display          Scribe for Treatment Team: Harden Mo, LCSW 04/07/2022 9:25 AM

## 2022-04-07 NOTE — Group Note (Signed)
[facility] LCSW Group Therapy Note    Group Date: 04/07/2022 Start Time: 1330 End Time: 1345  Type of Therapy and Topic:  Group Therapy:  Overcoming Obstacles  Participation Level:  [facility] PARTICIPATION LEVEL: Did Not Attend  Mood:  Description of Group:   In this group patients will be encouraged to explore what they see as obstacles to their own wellness and recovery. They will be guided to discuss their thoughts, feelings, and behaviors related to these obstacles. The group will process together ways to cope with barriers, with attention given to specific choices patients can make. Each patient will be challenged to identify changes they are motivated to make in order to overcome their obstacles. This group will be process-oriented, with patients participating in exploration of their own experiences as well as giving and receiving support and challenge from other group members.  Therapeutic Goals: 1. Patient will identify personal and current obstacles as they relate to admission. 2. Patient will identify barriers that currently interfere with their wellness or overcoming obstacles.  3. Patient will identify feelings, thought process and behaviors related to these barriers. 4. Patient will identify two changes they are willing to make to overcome these obstacles:    Summary of Patient Progress   Patient was asleep when group offered.  CSW provided patient with a worksheet in lieu of group.    Therapeutic Modalities:   Cognitive Behavioral Therapy Solution Focused Therapy Motivational Interviewing Relapse Prevention Therapy   Sadiya Durand J Damir Leung, LCSW 

## 2022-04-08 DIAGNOSIS — F203 Undifferentiated schizophrenia: Secondary | ICD-10-CM | POA: Diagnosis not present

## 2022-04-08 NOTE — Progress Notes (Signed)
[facility] MD Progress Note  04/08/2022 3:42 PM Danny Blackwell  MRN:  [id number] Subjective: A little bit to my surprise patient actually responded to my questions with something near lucidity.  Perhaps the Zyprexa really does work much better.  When I ask him this time where he was going at discharge he pointed at the list of homeless shelters on the wall.  He was able to tell me that he never really wanted to be in [place] but was looking for work.  He was not hostile or disorganized and actually seemed to be responding appropriately.  Has not been pacing quite as much today Principal Problem: Schizophrenia, paranoid (HCC) Diagnosis: Principal Problem:   Schizophrenia, paranoid (HCC)  Total Time spent with patient: 30 minutes  Past Psychiatric History: Past history of schizophrenia  Past Medical History: History reviewed. No pertinent past medical history. History reviewed. No pertinent surgical history. Family History: History reviewed. No pertinent family history. Family Psychiatric  History: None known Social History:  Social History   Substance and Sexual Activity  Alcohol Use None     Social History   Substance and Sexual Activity  Drug Use Not on file    Social History   Socioeconomic History   Marital status: Unknown    Spouse name: Not on file   Number of children: Not on file   Years of education: Not on file   Highest education level: Not on file  Occupational History   Not on file  Tobacco Use   Smoking status: Every Day    Packs/day: 1.00    Years: 15.00    Total pack years: 15.00    Types: Cigarettes   Smokeless tobacco: Never  Vaping Use   Vaping Use: Unknown  Substance and Sexual Activity   Alcohol use: Not on file   Drug use: Not on file   Sexual activity: Not on file  Other Topics Concern   Not on file  Social History Narrative   ** Merged History Encounter **       Social Determinants of Health   Financial Resource Strain: Not on file  Food  Insecurity: Not on file  Transportation Needs: Not on file  Physical Activity: Not on file  Stress: Not on file  Social Connections: Not on file   Additional Social History:                         Sleep: Fair  Appetite:  Fair  Current Medications: Current Facility-Administered Medications  Medication Dose Route Frequency Provider Last Rate Last Admin   acetaminophen (TYLENOL) tablet 650 mg  650 mg Oral Q6H PRN Vanetta Mulders, NP       alum & mag hydroxide-simeth (MAALOX/MYLANTA) 200-200-20 MG/5ML suspension 30 mL  30 mL Oral Q4H PRN Gabriel Cirri F, NP   30 mL at 04/04/22 2215   clonazePAM (KLONOPIN) tablet 0.5 mg  0.5 mg Oral BID Charm Rings, NP   0.5 mg at 04/08/22 2094   diphenhydrAMINE (BENADRYL) injection 50 mg  50 mg Intravenous Q6H PRN Daphine Loch T, MD       feeding supplement (ENSURE ENLIVE / ENSURE PLUS) liquid 237 mL  237 mL Oral BID BM Tariya Morrissette T, MD   237 mL at 04/08/22 1044   ferrous sulfate tablet 325 mg  325 mg Oral Q breakfast Terryl Molinelli, Jackquline Denmark, MD   325 mg at 04/08/22 0814   hydrOXYzine (ATARAX) tablet 25 mg  25 mg Oral TID PRN Vanetta Mulders, NP   25 mg at 04/07/22 2200   ibuprofen (ADVIL) tablet 600 mg  600 mg Oral Q6H PRN Lehi Phifer, Jackquline Denmark, MD       loperamide (IMODIUM) capsule 2 mg  2 mg Oral PRN Scott Vanderveer, Jackquline Denmark, MD       LORazepam (ATIVAN) tablet 2 mg  2 mg Oral Q6H PRN Gyasi Hazzard T, MD   2 mg at 04/07/22 2106   Or   LORazepam (ATIVAN) injection 2 mg  2 mg Intramuscular Q6H PRN Anishka Bushard T, MD       magnesium hydroxide (MILK OF MAGNESIA) suspension 30 mL  30 mL Oral Daily PRN Gabriel Cirri F, NP       OLANZapine zydis (ZYPREXA) disintegrating tablet 10 mg  10 mg Oral BID Carsen Leaf T, MD   10 mg at 04/08/22 0814   pantoprazole (PROTONIX) EC tablet 40 mg  40 mg Oral BID Adyen Bifulco T, MD   40 mg at 04/08/22 0814   sucralfate (CARAFATE) tablet 1 g  1 g Oral TID WC & HS Charm Rings, NP   1 g at 04/08/22 1144    ziprasidone (GEODON) injection 20 mg  20 mg Intramuscular Q12H PRN Trevino Wyatt, Jackquline Denmark, MD   20 mg at 03/31/22 1117    Lab Results: No results found for this or any previous visit (from the past 48 hour(s)).  Blood Alcohol level:  Lab Results  Component Value Date   ETH <10 03/25/2022   ETH <10 10/11/2021    Metabolic Disorder Labs: Lab Results  Component Value Date   HGBA1C 5.0 03/28/2022   MPG 96.8 03/28/2022   No results found for: "PROLACTIN" Lab Results  Component Value Date   CHOL 116 03/28/2022   TRIG 76 03/28/2022   HDL 44 03/28/2022   CHOLHDL 2.6 03/28/2022   VLDL 15 03/28/2022   LDLCALC 57 03/28/2022    Physical Findings: AIMS: Facial and Oral Movements Muscles of Facial Expression: None, normal Lips and Perioral Area: None, normal Jaw: None, normal Tongue: None, normal,Extremity Movements Upper (arms, wrists, hands, fingers): None, normal Lower (legs, knees, ankles, toes): None, normal, Trunk Movements Neck, shoulders, hips: None, normal, Overall Severity Severity of abnormal movements (highest score from questions above): None, normal Incapacitation due to abnormal movements: None, normal Patient's awareness of abnormal movements (rate only patient's report): No Awareness, Dental Status Current problems with teeth and/or dentures?: No Does patient usually wear dentures?: No  CIWA:    COWS:     Musculoskeletal: Strength & Muscle Tone: within normal limits Gait & Station: normal Patient leans: N/A  Psychiatric Specialty Exam:  Presentation  General Appearance: Bizarre; Disheveled  Eye Contact:Absent  Speech:Garbled  Speech Volume:Decreased  Handedness:Left   Mood and Affect  Mood:Dysphoric  Affect:Blunt; Inappropriate   Thought Process  Thought Processes:Disorganized; Irrevelant  Descriptions of Associations:Loose  Orientation:-- (UTA)  Thought Content:Illogical; Delusions  History of Schizophrenia/Schizoaffective  disorder:Yes  Duration of Psychotic Symptoms:Greater than six months  Hallucinations:No data recorded Ideas of Reference:Delusions; Paranoia  Suicidal Thoughts:No data recorded Homicidal Thoughts:No data recorded  Sensorium  Memory:-- (WILL NOT RESPOND TO QUESTIONS)  Judgment:Impaired  Insight:None   Executive Functions  Concentration:Poor  Attention Span:Poor  Recall:Poor  Fund of Knowledge:Poor  Language:Poor   Psychomotor Activity  Psychomotor Activity:No data recorded  Assets  Assets:Financial Resources/Insurance   Sleep  Sleep:No data recorded   Physical Exam: Physical Exam Vitals and nursing note reviewed.  Constitutional:  Appearance: Normal appearance.  HENT:     Head: Normocephalic and atraumatic.     Mouth/Throat:     Pharynx: Oropharynx is clear.  Eyes:     Pupils: Pupils are equal, round, and reactive to light.  Cardiovascular:     Rate and Rhythm: Normal rate and regular rhythm.  Pulmonary:     Effort: Pulmonary effort is normal.     Breath sounds: Normal breath sounds.  Abdominal:     General: Abdomen is flat.     Palpations: Abdomen is soft.  Musculoskeletal:        General: Normal range of motion.  Skin:    General: Skin is warm and dry.  Neurological:     General: No focal deficit present.     Mental Status: He is alert. Mental status is at baseline.  Psychiatric:        Attention and Perception: Attention normal.        Mood and Affect: Mood normal. Affect is blunt.        Speech: Speech is delayed.        Thought Content: Thought content normal.        Cognition and Memory: Cognition is impaired.    Review of Systems  Constitutional: Negative.   HENT: Negative.    Eyes: Negative.   Respiratory: Negative.    Cardiovascular: Negative.   Gastrointestinal: Negative.   Musculoskeletal: Negative.   Skin: Negative.   Neurological: Negative.   Psychiatric/Behavioral: Negative.     Blood pressure (!) 132/95, pulse  (!) 116, temperature 99.1 F (37.3 C), temperature source Oral, resp. rate 20, height 5\' 7"  (1.702 m), weight 65.8 kg, SpO2 100 %. Body mass index is 22.72 kg/m.   Treatment Plan Summary: Medication management and Plan continue with the olanzapine.  This is a real breakthrough and I think we can work with this to try and get him discharged.  We will see if we cannot aim for discharge perhaps by tomorrow if we can just arrange for referral to any shelter in the general area  , MD 04/08/2022, 3:42 PM

## 2022-04-08 NOTE — Group Note (Signed)
[facility] LCSW Group Therapy Note    Group Date: 04/08/2022 Start Time: 1330 End Time: 1430  Type of Therapy and Topic:  Group Therapy:  Overcoming Obstacles  Participation Level:  [facility] PARTICIPATION LEVEL: Did Not Attend   Description of Group:   In this group patients will be encouraged to explore what they see as obstacles to their own wellness and recovery. They will be guided to discuss their thoughts, feelings, and behaviors related to these obstacles. The group will process together ways to cope with barriers, with attention given to specific choices patients can make. Each patient will be challenged to identify changes they are motivated to make in order to overcome their obstacles. This group will be process-oriented, with patients participating in exploration of their own experiences as well as giving and receiving support and challenge from other group members.  Therapeutic Goals: 1. Patient will identify personal and current obstacles as they relate to admission. 2. Patient will identify barriers that currently interfere with their wellness or overcoming obstacles.  3. Patient will identify feelings, thought process and behaviors related to these barriers. 4. Patient will identify two changes they are willing to make to overcome these obstacles:    Summary of Patient Progress X   Therapeutic Modalities:   Cognitive Behavioral Therapy Solution Focused Therapy Motivational Interviewing Relapse Prevention Therapy   Milika Ventress R Glennie Rodda, LCSW 

## 2022-04-08 NOTE — Plan of Care (Signed)
Patient more interacting with staff and started smiling at times.Patient came to staff & asked for sugar. No aggressive behaviors noted. Patient does not answer any questions. When ask patient to take a shower he ignores and curses. Patient talking to himself and pacing in the hallway. Compliant with medications. Support and encouragement given.

## 2022-04-08 NOTE — Progress Notes (Signed)
   04/07/22 2100  Psych Admission Type (Psych Patients Only)  Admission Status Involuntary  Psychosocial Assessment  Patient Complaints None  Eye Contact Brief  Facial Expression Flat  Affect Labile;Preoccupied  Speech Elective mutism;Slow  Interaction Minimal  Motor Activity Pacing  Appearance/Hygiene Body odor;Bizarre;Disheveled  Behavior Characteristics Guarded  Mood Labile  Aggressive Behavior  Effect No apparent injury  Thought Process  Coherency Circumstantial;Disorganized  Content Preoccupation  Delusions None reported or observed  Perception WDL  Hallucination None reported or observed  Judgment Limited  Confusion Mild  Danger to Self  Current suicidal ideation? Denies  Self-Injurious Behavior  (no)  Agreement Not to Harm Self Yes  Description of Agreement verbal  Danger to Others  Danger to Others None reported or observed   Patient anxious pacing most of this shift. Very minimal in interactions. Denies SI/HI/A/VH and verbally contracted for safety.

## 2022-04-08 NOTE — Progress Notes (Signed)
Patient presents with bizarre affect, mumbled speech. Patient observed interacting appropriately with staff and peers on the unit. Pt compliant with medication administration per MD orders. Pt given education, support, and encouragement to be active in his treatment plan. Pt being monitored Q 15 minutes for safety per unit protocol, remains safe on the unit 

## 2022-04-08 NOTE — Progress Notes (Signed)
Recreation Therapy Notes   Date: 04/08/2022   Time: 10:00 am    Location: Craft room      Behavioral response: N/A   Intervention Topic: Time Management    Discussion/Intervention: Patient refused to attend group.    Clinical Observations/Feedback:  Patient refused to attend group.    Miguel Christiana LRT/CTRS       Danny Blackwell 04/08/2022 11:15 AM 

## 2022-04-09 ENCOUNTER — Other Ambulatory Visit: Payer: Self-pay

## 2022-04-09 DIAGNOSIS — F203 Undifferentiated schizophrenia: Secondary | ICD-10-CM | POA: Diagnosis not present

## 2022-04-09 MED ORDER — OLANZAPINE 10 MG PO TABS: 10.0000 mg | ORAL_TABLET | Freq: Two times a day (BID) | ORAL | 1 refills | Status: DC

## 2022-04-09 MED ORDER — OLANZAPINE 10 MG PO TABS
10.0000 mg | ORAL_TABLET | Freq: Two times a day (BID) | ORAL | Status: DC
  Administered 2022-04-09 – 2022-04-10 (×2): 10 mg via ORAL
  Filled 2022-04-09 (×2): qty 1

## 2022-04-09 MED ORDER — PANTOPRAZOLE SODIUM 40 MG PO TBEC
40.0000 mg | DELAYED_RELEASE_TABLET | Freq: Two times a day (BID) | ORAL | 0 refills | Status: DC
Start: 2022-04-09 — End: 2022-04-09
  Filled 2022-04-09: qty 60, 30d supply, fill #0

## 2022-04-09 MED ORDER — OLANZAPINE 10 MG PO TABS
10.0000 mg | ORAL_TABLET | Freq: Two times a day (BID) | ORAL | 0 refills | Status: DC
  Filled 2022-04-09: qty 60, 30d supply, fill #0
  Filled 2022-04-09: qty 53, 27d supply, fill #0

## 2022-04-09 MED ORDER — CLONAZEPAM 0.5 MG PO TABS: 0.5000 mg | ORAL_TABLET | Freq: Two times a day (BID) | ORAL | 1 refills | Status: AC

## 2022-04-09 MED ORDER — PANTOPRAZOLE SODIUM 40 MG PO TBEC
40.0000 mg | DELAYED_RELEASE_TABLET | Freq: Two times a day (BID) | ORAL | 1 refills | Status: AC
Start: 2022-04-09 — End: ?

## 2022-04-09 MED ORDER — HYDROXYZINE HCL 25 MG PO TABS: 25.0000 mg | ORAL_TABLET | Freq: Three times a day (TID) | ORAL | 1 refills | Status: AC | PRN

## 2022-04-09 MED ORDER — HYDROXYZINE HCL 25 MG PO TABS
25.0000 mg | ORAL_TABLET | Freq: Three times a day (TID) | ORAL | 0 refills | Status: DC | PRN
  Filled 2022-04-09: qty 60, 20d supply, fill #0

## 2022-04-09 NOTE — Group Note (Signed)
[facility] LCSW Group Therapy Note   Group Date: 04/09/2022 Start Time: 1300 End Time: 1400   Type of Therapy/Topic:  Group Therapy:  Emotion Regulation  Participation Level:  Did Not Attend    Description of Group:    The purpose of this group is to assist patients in learning to regulate negative emotions and experience positive emotions. Patients will be guided to discuss ways in which they have been vulnerable to their negative emotions. These vulnerabilities will be juxtaposed with experiences of positive emotions or situations, and patients challenged to use positive emotions to combat negative ones. Special emphasis will be placed on coping with negative emotions in conflict situations, and patients will process healthy conflict resolution skills.  Therapeutic Goals: Patient will identify two positive emotions or experiences to reflect on in order to balance out negative emotions:  Patient will label two or more emotions that they find the most difficult to experience:  Patient will be able to demonstrate positive conflict resolution skills through discussion or role plays:   Summary of Patient Progress: Group not held due to acuity.     Therapeutic Modalities:   Cognitive Behavioral Therapy Feelings Identification Dialectical Behavioral Therapy   Christne Platts R Kelsha Older, LCSW 

## 2022-04-09 NOTE — Progress Notes (Signed)
[facility] MD Progress Note  04/09/2022 6:18 PM TERI [facility]  MRN:  [id number] Subjective: Patient not presenting with any acutely dangerous behavior.  No new complaints. Principal Problem: Schizophrenia, paranoid ([place]) Diagnosis: Principal Problem:   Schizophrenia, paranoid ([place])  Total Time spent with patient: 15 minutes  Past Psychiatric History: Past history of schizophrenia  Past Medical History: History reviewed. No pertinent past medical history. History reviewed. No pertinent surgical history. Family History: History reviewed. No pertinent family history. Family Psychiatric  History: See previous Social History:  Social History   Substance and Sexual Activity  Alcohol Use None     Social History   Substance and Sexual Activity  Drug Use Not on file    Social History   Socioeconomic History   Marital status: Unknown    Spouse name: Not on file   Number of children: Not on file   Years of education: Not on file   Highest education level: Not on file  Occupational History   Not on file  Tobacco Use   Smoking status: Every Day    Packs/day: 1.00    Years: 15.00    Total pack years: 15.00    Types: Cigarettes   Smokeless tobacco: Never  Vaping Use   Vaping Use: Unknown  Substance and Sexual Activity   Alcohol use: Not on file   Drug use: Not on file   Sexual activity: Not on file  Other Topics Concern   Not on file  Social History Narrative   ** Merged History Encounter **       Social Determinants of Health   Financial Resource Strain: Not on file  Food Insecurity: Not on file  Transportation Needs: Not on file  Physical Activity: Not on file  Stress: Not on file  Social Connections: Not on file   Additional Social History:                         Sleep: Fair  Appetite:  Fair  Current Medications: Current Facility-Administered Medications  Medication Dose Route Frequency Provider Last Rate Last Admin   acetaminophen (TYLENOL) tablet  650 mg  650 mg Oral Q6H PRN Sherlon Handing, NP       alum & mag hydroxide-simeth (MAALOX/MYLANTA) 200-200-20 MG/5ML suspension 30 mL  30 mL Oral Q4H PRN Waldon Merl F, NP   30 mL at 04/04/22 2215   clonazePAM (KLONOPIN) tablet 0.5 mg  0.5 mg Oral BID Patrecia Pour, NP   0.5 mg at 04/09/22 1719   diphenhydrAMINE (BENADRYL) injection 50 mg  50 mg Intravenous Q6H PRN Korrey Schleicher T, MD       feeding supplement (ENSURE ENLIVE / ENSURE PLUS) liquid 237 mL  237 mL Oral BID BM Jabria Loos T, MD   237 mL at 04/09/22 1456   ferrous sulfate tablet 325 mg  325 mg Oral Q breakfast Caddie Randle T, MD   325 mg at 04/09/22 [id number]   hydrOXYzine (ATARAX) tablet 25 mg  25 mg Oral TID PRN Sherlon Handing, NP   25 mg at 04/07/22 2200   ibuprofen (ADVIL) tablet 600 mg  600 mg Oral Q6H PRN Doriana Mazurkiewicz, Madie Reno, MD       loperamide (IMODIUM) capsule 2 mg  2 mg Oral PRN Burle Kwan, Madie Reno, MD       LORazepam (ATIVAN) tablet 2 mg  2 mg Oral Q6H PRN Tylesha Gibeault, Madie Reno, MD   2 mg at 04/07/22 2106  Or   LORazepam (ATIVAN) injection 2 mg  2 mg Intramuscular Q6H PRN Brodrick Curran T, MD       magnesium hydroxide (MILK OF MAGNESIA) suspension 30 mL  30 mL Oral Daily PRN Gabriel Cirri F, NP       OLANZapine (ZYPREXA) tablet 10 mg  10 mg Oral BID Adilen Pavelko T, MD   10 mg at 04/09/22 1720   pantoprazole (PROTONIX) EC tablet 40 mg  40 mg Oral BID Amaru Burroughs T, MD   40 mg at 04/09/22 1720   sucralfate (CARAFATE) tablet 1 g  1 g Oral TID WC & HS Charm Rings, NP   1 g at 04/09/22 1718   ziprasidone (GEODON) injection 20 mg  20 mg Intramuscular Q12H PRN Aveah Castell, Jackquline Denmark, MD   20 mg at 03/31/22 1117    Lab Results: No results found for this or any previous visit (from the past 48 hour(s)).  Blood Alcohol level:  Lab Results  Component Value Date   ETH <10 03/25/2022   ETH <10 10/11/2021    Metabolic Disorder Labs: Lab Results  Component Value Date   HGBA1C 5.0 03/28/2022   MPG 96.8 03/28/2022   No results  found for: "PROLACTIN" Lab Results  Component Value Date   CHOL 116 03/28/2022   TRIG 76 03/28/2022   HDL 44 03/28/2022   CHOLHDL 2.6 03/28/2022   VLDL 15 03/28/2022   LDLCALC 57 03/28/2022    Physical Findings: AIMS: Facial and Oral Movements Muscles of Facial Expression: None, normal Lips and Perioral Area: None, normal Jaw: None, normal Tongue: None, normal,Extremity Movements Upper (arms, wrists, hands, fingers): None, normal Lower (legs, knees, ankles, toes): None, normal, Trunk Movements Neck, shoulders, hips: None, normal, Overall Severity Severity of abnormal movements (highest score from questions above): None, normal Incapacitation due to abnormal movements: None, normal Patient's awareness of abnormal movements (rate only patient's report): No Awareness, Dental Status Current problems with teeth and/or dentures?: No Does patient usually wear dentures?: No  CIWA:    COWS:     Musculoskeletal: Strength & Muscle Tone: within normal limits Gait & Station: normal Patient leans: N/A  Psychiatric Specialty Exam:  Presentation  General Appearance: Bizarre; Disheveled  Eye Contact:Absent  Speech:Garbled  Speech Volume:Decreased  Handedness:Left   Mood and Affect  Mood:Dysphoric  Affect:Blunt; Inappropriate   Thought Process  Thought Processes:Disorganized; Irrevelant  Descriptions of Associations:Loose  Orientation:-- (UTA)  Thought Content:Illogical; Delusions  History of Schizophrenia/Schizoaffective disorder:Yes  Duration of Psychotic Symptoms:Greater than six months  Hallucinations:No data recorded Ideas of Reference:Delusions; Paranoia  Suicidal Thoughts:No data recorded Homicidal Thoughts:No data recorded  Sensorium  Memory:-- (WILL NOT RESPOND TO QUESTIONS)  Judgment:Impaired  Insight:None   Executive Functions  Concentration:Poor  Attention Span:Poor  Recall:Poor  Fund of Knowledge:Poor  Language:Poor   Psychomotor  Activity  Psychomotor Activity:No data recorded  Assets  Assets:Financial Resources/Insurance   Sleep  Sleep:No data recorded   Physical Exam: Physical Exam Vitals and nursing note reviewed.  Constitutional:      Appearance: Normal appearance.  HENT:     Head: Normocephalic and atraumatic.     Mouth/Throat:     Pharynx: Oropharynx is clear.  Eyes:     Pupils: Pupils are equal, round, and reactive to light.  Cardiovascular:     Rate and Rhythm: Normal rate and regular rhythm.  Pulmonary:     Effort: Pulmonary effort is normal.     Breath sounds: Normal breath sounds.  Abdominal:  General: Abdomen is flat.     Palpations: Abdomen is soft.  Musculoskeletal:        General: Normal range of motion.  Skin:    General: Skin is warm and dry.  Neurological:     General: No focal deficit present.     Mental Status: He is alert. Mental status is at baseline.  Psychiatric:        Attention and Perception: He is inattentive.        Mood and Affect: Mood normal. Affect is blunt.        Speech: Speech normal.        Behavior: Behavior is cooperative.        Thought Content: Thought content normal.    Review of Systems  Constitutional: Negative.   HENT: Negative.    Eyes: Negative.   Respiratory: Negative.    Cardiovascular: Negative.   Gastrointestinal: Negative.   Musculoskeletal: Negative.   Skin: Negative.   Neurological: Negative.   Psychiatric/Behavioral: Negative.     Blood pressure 113/77, pulse (!) 124, temperature 97.7 F (36.5 C), temperature source Oral, resp. rate 18, height 5\' 7"  (1.702 m), weight 65.8 kg, SpO2 99 %. Body mass index is 22.72 kg/m.   Treatment Plan Summary: Plan no change to current medication.  Had plan to discharge the patient today but shelters were not available.  Will defer discharge still tomorrow  , MD 04/09/2022, 6:18 PM

## 2022-04-09 NOTE — Progress Notes (Signed)
Recreation Therapy Notes    Date: 04/09/2022   Time: 10:20 am    Location: Court yard     Behavioral response: N/A   Intervention Topic: Decision Making    Discussion/Intervention: Patient refused to attend group.    Clinical Observations/Feedback:  Patient refused to attend group.    Arieon Corcoran LRT/CTRS        Haelie Clapp 04/09/2022 10:46 AM

## 2022-04-09 NOTE — Plan of Care (Signed)
  Problem: Group Participation Goal: STG - Patient will engage in groups without prompting or encouragement from LRT x3 group sessions within 5 recreation therapy group sessions Description: STG - Patient will engage in groups without prompting or encouragement from LRT x3 group sessions within 5 recreation therapy group sessions 04/09/2022 1057 by Ernest Haber, LRT Outcome: Not Applicable 10/07/2479 [id number] by Ernest Haber, LRT Outcome: Not Met (add Reason) Note: Patient spent most of his time in his room.

## 2022-04-09 NOTE — Discharge Summary (Signed)
Physician Discharge Summary Note  Patient:  Danny Blackwell is an 51 y.o., male MRN:  [id number] DOB:  1970-09-02 Patient phone:  [phone] (home)  Patient address:   [address] [place] [address],  Total Time spent with patient: 30 minutes  Date of Admission:  03/26/2022 Date of Discharge: 04/09/2022  Reason for Admission: Admitted after being brought to the hospital for disorganized odd behavior incoherent speech presumed psychosis  Principal Problem: Schizophrenia, paranoid [facility]) Discharge Diagnoses: Principal Problem:   Schizophrenia, paranoid ([place])   Past Psychiatric History: We confirmed at least 1 prior admission for schizophrenia but patient not willing to share other information  Past Medical History: History reviewed. No pertinent past medical history. History reviewed. No pertinent surgical history. Family History: History reviewed. No pertinent family history. Family Psychiatric  History: None reported Social History:  Social History   Substance and Sexual Activity  Alcohol Use None     Social History   Substance and Sexual Activity  Drug Use Not on file    Social History   Socioeconomic History   Marital status: Unknown    Spouse name: Not on file   Number of children: Not on file   Years of education: Not on file   Highest education level: Not on file  Occupational History   Not on file  Tobacco Use   Smoking status: Every Day    Packs/day: 1.00    Years: 15.00    Total pack years: 15.00    Types: Cigarettes   Smokeless tobacco: Never  Vaping Use   Vaping Use: Unknown  Substance and Sexual Activity   Alcohol use: Not on file   Drug use: Not on file   Sexual activity: Not on file  Other Topics Concern   Not on file  Social History Narrative   ** Merged History Encounter **       Social Determinants of Health   Financial Resource Strain: Not on file  Food Insecurity: Not on file  Transportation Needs: Not on file  Physical  Activity: Not on file  Stress: Not on file  Social Connections: Not on file    Hospital Course: Admitted to psychiatric hospital.  Very disorganized and psychotic and uncooperative but not violent or threatening.  No suicidal behavior.  Treated with haloperidol for several days with minimal improvement.  Medicine switched to olanzapine showed improvement able to engage in more coherent conversation.  Patient is requesting discharge saying that he will go stay at homeless shelters.  He has no other place to stay locally.  He will be provided with a supply of medicine and prescriptions and strongly encouraged to stay away from substance abuse and engage in outpatient mental health treatment.  1 medical issue that arose during his time here was consistent abdominal pain which, combined with his anemia suggested to me a likely GI bleed.  Patient refused to cooperate with further blood tests so we could never confirm it.  He has been taking pantoprazole and the discomfort seems to have improved.  Physical Findings: AIMS: Facial and Oral Movements Muscles of Facial Expression: None, normal Lips and Perioral Area: None, normal Jaw: None, normal Tongue: None, normal,Extremity Movements Upper (arms, wrists, hands, fingers): None, normal Lower (legs, knees, ankles, toes): None, normal, Trunk Movements Neck, shoulders, hips: None, normal, Overall Severity Severity of abnormal movements (highest score from questions above): None, normal Incapacitation due to abnormal movements: None, normal Patient's awareness of abnormal movements (rate only patient's report): No Awareness,  Dental Status Current problems with teeth and/or dentures?: No Does patient usually wear dentures?: No  CIWA:    COWS:     Musculoskeletal: Strength & Muscle Tone: within normal limits Gait & Station: normal Patient leans: N/A   Psychiatric Specialty Exam:  Presentation  General Appearance: Bizarre; Disheveled  Eye  Contact:Absent  Speech:Garbled  Speech Volume:Decreased  Handedness:Left   Mood and Affect  Mood:Dysphoric  Affect:Blunt; Inappropriate   Thought Process  Thought Processes:Disorganized; Irrevelant  Descriptions of Associations:Loose  Orientation:-- (UTA)  Thought Content:Illogical; Delusions  History of Schizophrenia/Schizoaffective disorder:Yes  Duration of Psychotic Symptoms:Greater than six months  Hallucinations:No data recorded Ideas of Reference:Delusions; Paranoia  Suicidal Thoughts:No data recorded Homicidal Thoughts:No data recorded  Sensorium  Memory:-- (WILL NOT RESPOND TO QUESTIONS)  Judgment:Impaired  Insight:None   Executive Functions  Concentration:Poor  Attention Span:Poor  Recall:Poor  Fund of Knowledge:Poor  Language:Poor   Psychomotor Activity  Psychomotor Activity:No data recorded  Assets  Assets:Financial Resources/Insurance   Sleep  Sleep:No data recorded   Physical Exam: Physical Exam Vitals and nursing note reviewed.  Constitutional:      Appearance: Normal appearance.  HENT:     Head: Normocephalic and atraumatic.     Mouth/Throat:     Pharynx: Oropharynx is clear.  Eyes:     Pupils: Pupils are equal, round, and reactive to light.  Cardiovascular:     Rate and Rhythm: Normal rate and regular rhythm.  Pulmonary:     Effort: Pulmonary effort is normal.     Breath sounds: Normal breath sounds.  Abdominal:     General: Abdomen is flat.     Palpations: Abdomen is soft.  Musculoskeletal:        General: Normal range of motion.  Skin:    General: Skin is warm and dry.  Neurological:     General: No focal deficit present.     Mental Status: He is alert. Mental status is at baseline.  Psychiatric:        Mood and Affect: Mood normal.        Thought Content: Thought content normal.    Review of Systems  Constitutional: Negative.   HENT: Negative.    Eyes: Negative.   Respiratory: Negative.     Cardiovascular: Negative.   Gastrointestinal: Negative.   Musculoskeletal: Negative.   Skin: Negative.   Neurological: Negative.   Psychiatric/Behavioral: Negative.     Blood pressure 123/81, pulse (!) 110, temperature 98.4 F (36.9 C), temperature source Oral, resp. rate 18, height 5\' 7"  (1.702 m), weight 65.8 kg, SpO2 99 %. Body mass index is 22.72 kg/m.   Social History   Tobacco Use  Smoking Status Every Day   Packs/day: 1.00   Years: 15.00   Total pack years: 15.00   Types: Cigarettes  Smokeless Tobacco Never   Tobacco Cessation:  A prescription for an FDA-approved tobacco cessation medication was offered at discharge and the patient refused   Blood Alcohol level:  Lab Results  Component Value Date   [facility] <10 03/25/2022   ETH <10 10/11/2021    Metabolic Disorder Labs:  Lab Results  Component Value Date   HGBA1C 5.0 03/28/2022   MPG 96.8 03/28/2022   No results found for: "PROLACTIN" Lab Results  Component Value Date   CHOL 116 03/28/2022   TRIG 76 03/28/2022   HDL 44 03/28/2022   CHOLHDL 2.6 03/28/2022   VLDL 15 03/28/2022   LDLCALC 57 03/28/2022    See Psychiatric Specialty Exam and Suicide Risk  Assessment completed by Attending Physician prior to discharge.  Discharge destination:  Home  Is patient on multiple antipsychotic therapies at discharge:  No   Has Patient had three or more failed trials of antipsychotic monotherapy by history:  No  Recommended Plan for Multiple Antipsychotic Therapies: NA  Discharge Instructions     Diet - low sodium heart healthy   Complete by: As directed    Increase activity slowly   Complete by: As directed       Allergies as of 04/09/2022   No Known Allergies      Medication List     TAKE these medications      Indication  clonazePAM 0.5 MG tablet Commonly known as: KLONOPIN Take 1 tablet (0.5 mg total) by mouth 2 (two) times daily.  Indication: Feeling Anxious   hydrOXYzine 25 MG  tablet Commonly known as: ATARAX Take 1 tablet (25 mg total) by mouth 3 (three) times daily as needed for anxiety.  Indication: Feeling Anxious   OLANZapine 10 MG tablet Commonly known as: ZYPREXA Take 1 tablet (10 mg total) by mouth 2 (two) times daily. What changed:  medication strength how much to take  Indication: Schizophrenia   pantoprazole 40 MG tablet Commonly known as: PROTONIX Take 1 tablet (40 mg total) by mouth 2 (two) times daily.  Indication: Stomach Ulcer, Gastroesophageal Reflux Disease         Follow-up recommendations:  Other:  Discharge to follow up with [facility] or local provider.  Prescription and medication supply given  Comments: See above  Signed: Mordecai Rasmussen, MD 04/09/2022, 3:59 PM

## 2022-04-09 NOTE — Plan of Care (Signed)
Patient in & out of room. Visible in the milieu. Patient more interacting with staff & peers. Denies SI,HI and AVH. Patients affect brighter at this time. Patient does not want to take a shower even with multiple prompting. Compliant with medications. Appetite and energy level good. Support and encouragement given.

## 2022-04-09 NOTE — Progress Notes (Signed)
Recreation Therapy Notes  INPATIENT RECREATION TR PLAN  Patient Details Name: Danny Blackwell MRN: [id number] DOB: 09/27/1970 Today's Date: 04/09/2022  Rec Therapy Plan Is patient appropriate for Therapeutic Recreation?: Yes Treatment times per week: at least 3 Estimated Length of Stay: 5-7 days TR Treatment/Interventions: Group participation (Comment)  Discharge Criteria Pt will be discharged from therapy if:: Discharged Treatment plan/goals/alternatives discussed and agreed upon by:: Patient/family  Discharge Summary Short term goals set: Patient will engage in groups without prompting or encouragement from LRT x3 group sessions within 5 recreation therapy group sessions Short term goals met: Not met Progress toward goals comments: Groups attended Which groups?: Social skills Reason goals not met: Patient spent most of his time in his room Therapeutic equipment acquired: N/A Reason patient discharged from therapy: Discharge from hospital Pt/family agrees with progress & goals achieved: Yes Date patient discharged from therapy: 04/09/22   Ermalee Mealy 04/09/2022, 10:59 AM

## 2022-04-09 NOTE — Progress Notes (Signed)
CSW contacted [facility] [employer] regarding bed availability. CSW informed there's no bed availability today.   CSW met with pt briefly to discuss discharge. He stated that he needs someone with an address so he can go to social security and get his stuff in order. Pt denied interest in continued outpt treatment. When asked if he wanted to continue his medication, pt stated that it does not work for him. Pt is a smoker and denied interest in cessation. He denied any use of substances. CSW informed him that he would be given a Industrial/product designer as [facility] did not have availability. He agreed. No other concerns expressed. Contact ended without incident.   Pt updated and given shelter resource list. Pt and CSW chatted briefly. He stated that he really just needs a ride to the social security building. CSW stated that shelters would be contacted tomorrow to see where he could go. He agreed. No other concerns expressed. Contact ended without incident.   Danny Blackwell. Guerry Bruin, MSW, [place], Home 04/09/2022 4:40 PM

## 2022-04-09 NOTE — BHH Suicide Risk Assessment (Signed)
[facility] Discharge Suicide Risk Assessment   Principal Problem: Schizophrenia, paranoid ([facility]) Discharge Diagnoses: Principal Problem:   Schizophrenia, paranoid ([facility])   Total Time spent with patient: 30 minutes  Musculoskeletal: Strength & Muscle Tone: within normal limits Gait & Station: normal Patient leans: N/A  Psychiatric Specialty Exam  Presentation  General Appearance: Bizarre; Disheveled  Eye Contact:Absent  Speech:Garbled  Speech Volume:Decreased  Handedness:Left   Mood and Affect  Mood:Dysphoric  Duration of Depression Symptoms: No data recorded Affect:Blunt; Inappropriate   Thought Process  Thought Processes:Disorganized; Irrevelant  Descriptions of Associations:Loose  Orientation:-- (UTA)  Thought Content:Illogical; Delusions  History of Schizophrenia/Schizoaffective disorder:Yes  Duration of Psychotic Symptoms:Greater than six months  Hallucinations:No data recorded Ideas of Reference:Delusions; Paranoia  Suicidal Thoughts:No data recorded Homicidal Thoughts:No data recorded  Sensorium  Memory:-- (WILL NOT RESPOND TO QUESTIONS)  Judgment:Impaired  Insight:None   Executive Functions  Concentration:Poor  Attention Span:Poor  Recall:Poor  Fund of Knowledge:Poor  Language:Poor   Psychomotor Activity  Psychomotor Activity:No data recorded  Assets  Assets:Financial Resources/Insurance   Sleep  Sleep:No data recorded  Physical Exam: Physical Exam Vitals and nursing note reviewed.  Constitutional:      Appearance: Normal appearance.  HENT:     Head: Normocephalic and atraumatic.     Mouth/Throat:     Pharynx: Oropharynx is clear.  Eyes:     Pupils: Pupils are equal, round, and reactive to light.  Cardiovascular:     Rate and Rhythm: Normal rate and regular rhythm.  Pulmonary:     Effort: Pulmonary effort is normal.     Breath sounds: Normal breath sounds.  Abdominal:     General: Abdomen is flat.     Palpations:  Abdomen is soft.  Musculoskeletal:        General: Normal range of motion.  Skin:    General: Skin is warm and dry.  Neurological:     General: No focal deficit present.     Mental Status: He is alert. Mental status is at baseline.  Psychiatric:        Attention and Perception: Attention normal.        Mood and Affect: Mood normal. Affect is blunt.        Speech: Speech is delayed.        Behavior: Behavior is slowed.        Thought Content: Thought content normal.        Cognition and Memory: Cognition normal.        Judgment: Judgment is impulsive.    Review of Systems  Constitutional: Negative.   HENT: Negative.    Eyes: Negative.   Respiratory: Negative.    Cardiovascular: Negative.   Gastrointestinal: Negative.   Musculoskeletal: Negative.   Skin: Negative.   Neurological: Negative.   Psychiatric/Behavioral: Negative.     Blood pressure 123/81, pulse (!) 110, temperature 98.4 F (36.9 C), temperature source Oral, resp. rate 18, height 5\' 7"  (1.702 m), weight 65.8 kg, SpO2 99 %. Body mass index is 22.72 kg/m.  Mental Status Per Nursing Assessment::   On Admission:  NA  Demographic Factors:  Male, Caucasian, Low socioeconomic status, and Living alone  Loss Factors: NA  Historical Factors: Impulsivity  Risk Reduction Factors:   NA  Continued Clinical Symptoms:  Schizophrenia:   Paranoid or undifferentiated type  Cognitive Features That Contribute To Risk:  Thought constriction (tunnel vision)    Suicide Risk:  Minimal: No identifiable suicidal ideation.  Patients presenting with no risk factors but with morbid  ruminations; may be classified as minimal risk based on the severity of the depressive symptoms    Plan Of Care/Follow-up recommendations:  Other:  Patient denies all suicidal ideation.  Has not displayed any dangerous behavior in the hospital.  No concern about suicide on admission.  Patient has been cooperative with medication.  No longer meets  commitment criteria.  Discharged from hospital with referral to follow up with outpatient psychiatric treatment  Mordecai Rasmussen, MD 04/09/2022, 3:55 PM

## 2022-04-09 NOTE — Progress Notes (Signed)
Patient presents with bizarre affect, mumbled speech. Patient observed interacting appropriately with staff and peers on the unit. Pt compliant with medication administration per MD orders. Pt given education, support, and encouragement to be active in his treatment plan. Pt being monitored Q 15 minutes for safety per unit protocol, remains safe on the unit

## 2022-04-09 NOTE — BHH Group Notes (Signed)
B[facility] Group Notes:  (Nursing/MHT/Case Management/Adjunct)  Date:  04/09/2022  Time:  9:09 PM  Type of Therapy:   Wrap up  Participation Level:  Did Not Attend   Summary of Progress/Problems:  Danny Blackwell 04/09/2022, 9:09 PM

## 2022-04-10 DIAGNOSIS — F203 Undifferentiated schizophrenia: Secondary | ICD-10-CM | POA: Diagnosis not present

## 2022-04-10 NOTE — Progress Notes (Signed)
Discharge note: RN met with pt and reviewed pt's discharge instructions. Pt verbalized understanding of discharge instructions and pt did not have any questions. RN reviewed and provided pt with a copy of SRA, AVS and Transition Record. RN returned pt's belongings to pt.  Prescriptions and samples were given to pt. Pt denied SI/HI/AVH and voiced no concerns. Pt was appreciative of the care pt received at [facility]. Patient discharged to the taxi without incident.  Problem: Education: Goal: Knowledge of General Education information will improve Description: Including pain rating scale, medication(s)/side effects and non-pharmacologic comfort measures Outcome: Adequate for Discharge   Problem: Elimination: Goal: Will not experience complications related to bowel motility Outcome: Adequate for Discharge     04/10/22 1000  Psych Admission Type (Psych Patients Only)  Admission Status Involuntary  Psychosocial Assessment  Patient Complaints None  Eye Contact Staring;Watchful  Facial Expression Flat;Pensive  Affect Preoccupied  Speech Soft;Rapid  Interaction Minimal;No initiation;Forwards little  Motor Activity Pacing  Appearance/Hygiene Poor hygiene  Behavior Characteristics Guarded;Cooperative;Pacing  Mood Labile;Preoccupied  Aggressive Behavior  Effect No apparent injury  Thought Process  Coherency Disorganized  Content Preoccupation  Delusions None reported or observed  Perception WDL  Hallucination None reported or observed  Judgment Limited  Confusion Mild  Danger to Self  Current suicidal ideation? Denies  Danger to Others  Danger to Others None reported or observed

## 2022-04-10 NOTE — Progress Notes (Signed)
  [facility] Adult Case Management Discharge Plan :  Will you be returning to the same living situation after discharge:  No. At discharge, do you have transportation home?: Yes,  CSW to assist with transportation needs.  Do you have the ability to pay for your medications: Yes,  [employer]  Release of information consent forms completed and in the chart;  Patient's signature needed at discharge.  Patient to Follow up at:  Follow-up Information     [facility] Follow up.   Specialty: Urgent Care Why: Psychiatry (medication management) walk in are on Monday, Wednesday, Thursday and Friday 8AM to 11AM.  Please arrive at 7:30AM.  Therapy walk in appointments are Monday and Wednesday 8Am to 11AM.  Please arrive at 7:30AM. Contact information: [phone] [place] [address] [phone]                Next level of care provider has access to [facility] Link:yes  Safety Planning and Suicide Prevention discussed: Yes,  SPE completed with the patient.      Has patient been referred to the Quitline?: Patient refused referral  Patient has been referred for addiction treatment: Pt. refused referral  Harden Mo, LCSW 04/10/2022, 10:17 AM

## 2022-04-10 NOTE — BHH Counselor (Signed)
CSW consulted with physician on the patient's discharge.  Physician identified no concerns.   CSW confirmed that patient had a supply of medication and prescriptions.    CSW reviewed with the patient aftercare and patient declined.  CSW reviewed with the patient the [facility] and the resources.  Patient was agreeable to [facility] discharge.   Penni Homans, MSW, LCSW 04/10/2022 2:08 PM

## 2022-04-10 NOTE — Discharge Summary (Signed)
Physician Discharge Summary Note  Patient:  Danny Blackwell is an 51 y.o., male MRN:  [id number] DOB:  1970-12-11 Patient phone:  [phone] (home)  Patient address:   [address] [place] [address],  Total Time spent with patient: 30 minutes  Date of Admission:  03/26/2022 Date of Discharge: 04/10/2022  Reason for Admission: Patient admitted because of psychosis and paranoia odd behavior in public  Principal Problem: Schizophrenia, paranoid [facility]) Discharge Diagnoses: Principal Problem:   Schizophrenia, paranoid (HCC)   Past Psychiatric History: Past history of schizophrenia with at least 1 known previous hospitalization  Past Medical History: History reviewed. No pertinent past medical history. History reviewed. No pertinent surgical history. Family History: History reviewed. No pertinent family history. Family Psychiatric  History: No information Social History:  Social History   Substance and Sexual Activity  Alcohol Use None     Social History   Substance and Sexual Activity  Drug Use Not on file    Social History   Socioeconomic History   Marital status: Unknown    Spouse name: Not on file   Number of children: Not on file   Years of education: Not on file   Highest education level: Not on file  Occupational History   Not on file  Tobacco Use   Smoking status: Every Day    Packs/day: 1.00    Years: 15.00    Total pack years: 15.00    Types: Cigarettes   Smokeless tobacco: Never  Vaping Use   Vaping Use: Unknown  Substance and Sexual Activity   Alcohol use: Not on file   Drug use: Not on file   Sexual activity: Not on file  Other Topics Concern   Not on file  Social History Narrative   ** Merged History Encounter **       Social Determinants of Health   Financial Resource Strain: Not on file  Food Insecurity: Not on file  Transportation Needs: Not on file  Physical Activity: Not on file  Stress: Not on file  Social Connections: Not on  file    Hospital Course: Admitted to the psychiatric unit.  Very withdrawn and uncooperative with the evaluation declining to speak with staff much of the time.  Initially treated with haloperidol did not show much response but once medicine was changed to olanzapine showed some improvement.  At no point was he dangerous aggressive violent or suicidal.  He has complained of abdominal discomfort but has declined recommendations for evaluation by GI.  He has received treatment with pantoprazole but otherwise declines lab studies or other evaluation.  At this point appears to be at his baseline and unlikely to further benefit from hospital stay.  Once to be discharged with the hopes that he will be able to go to a shelter.  Patient is given a 30-day supply of his medicine including stomach acid medicine and antipsychotic and will be given referrals to local mental health providers  Physical Findings: AIMS: Facial and Oral Movements Muscles of Facial Expression: None, normal Lips and Perioral Area: None, normal Jaw: None, normal Tongue: None, normal,Extremity Movements Upper (arms, wrists, hands, fingers): None, normal Lower (legs, knees, ankles, toes): None, normal, Trunk Movements Neck, shoulders, hips: None, normal, Overall Severity Severity of abnormal movements (highest score from questions above): None, normal Incapacitation due to abnormal movements: None, normal Patient's awareness of abnormal movements (rate only patient's report): No Awareness, Dental Status Current problems with teeth and/or dentures?: No Does patient usually wear dentures?:  No  CIWA:    COWS:     Musculoskeletal: Strength & Muscle Tone: within normal limits Gait & Station: normal Patient leans: N/A   Psychiatric Specialty Exam:  Presentation  General Appearance: Bizarre; Disheveled  Eye Contact:Absent  Speech:Garbled  Speech Volume:Decreased  Handedness:Left   Mood and Affect   Mood:Dysphoric  Affect:Blunt; Inappropriate   Thought Process  Thought Processes:Disorganized; Irrevelant  Descriptions of Associations:Loose  Orientation:-- (UTA)  Thought Content:Illogical; Delusions  History of Schizophrenia/Schizoaffective disorder:Yes  Duration of Psychotic Symptoms:Greater than six months  Hallucinations:No data recorded Ideas of Reference:Delusions; Paranoia  Suicidal Thoughts:No data recorded Homicidal Thoughts:No data recorded  Sensorium  Memory:-- (WILL NOT RESPOND TO QUESTIONS)  Judgment:Impaired  Insight:None   Executive Functions  Concentration:Poor  Attention Span:Poor  Recall:Poor  Fund of Knowledge:Poor  Language:Poor   Psychomotor Activity  Psychomotor Activity:No data recorded  Assets  Assets:Financial Resources/Insurance   Sleep  Sleep:No data recorded   Physical Exam: Physical Exam Vitals and nursing note reviewed.  Constitutional:      Appearance: Normal appearance.  HENT:     Head: Normocephalic and atraumatic.     Mouth/Throat:     Pharynx: Oropharynx is clear.  Eyes:     Pupils: Pupils are equal, round, and reactive to light.  Cardiovascular:     Rate and Rhythm: Normal rate and regular rhythm.  Pulmonary:     Effort: Pulmonary effort is normal.     Breath sounds: Normal breath sounds.  Abdominal:     General: Abdomen is flat.     Palpations: Abdomen is soft.  Musculoskeletal:        General: Normal range of motion.  Skin:    General: Skin is warm and dry.  Neurological:     General: No focal deficit present.     Mental Status: He is alert. Mental status is at baseline.  Psychiatric:        Attention and Perception: He is inattentive.        Mood and Affect: Mood normal. Affect is blunt.        Speech: Speech is delayed.        Behavior: Behavior is withdrawn.        Thought Content: Thought content normal.    Review of Systems  Constitutional: Negative.   HENT: Negative.    Eyes:  Negative.   Respiratory: Negative.    Cardiovascular: Negative.   Gastrointestinal:  Positive for abdominal pain.  Musculoskeletal: Negative.   Skin: Negative.   Neurological: Negative.   Psychiatric/Behavioral: Negative.     Blood pressure 111/74, pulse (!) 101, temperature 97.8 F (36.6 C), temperature source Oral, resp. rate 18, height 5\' 7"  (1.702 m), weight 65.8 kg, SpO2 100 %. Body mass index is 22.72 kg/m.   Social History   Tobacco Use  Smoking Status Every Day   Packs/day: 1.00   Years: 15.00   Total pack years: 15.00   Types: Cigarettes  Smokeless Tobacco Never   Tobacco Cessation:  A prescription for an FDA-approved tobacco cessation medication was offered at discharge and the patient refused   Blood Alcohol level:  Lab Results  Component Value Date   [facility] <10 03/25/2022   ETH <10 10/11/2021    Metabolic Disorder Labs:  Lab Results  Component Value Date   HGBA1C 5.0 03/28/2022   MPG 96.8 03/28/2022   No results found for: "PROLACTIN" Lab Results  Component Value Date   CHOL 116 03/28/2022   TRIG 76 03/28/2022   HDL  44 03/28/2022   CHOLHDL 2.6 03/28/2022   VLDL 15 03/28/2022   LDLCALC 57 03/28/2022    See Psychiatric Specialty Exam and Suicide Risk Assessment completed by Attending Physician prior to discharge.  Discharge destination:  Home  Is patient on multiple antipsychotic therapies at discharge:  No   Has Patient had three or more failed trials of antipsychotic monotherapy by history:  No  Recommended Plan for Multiple Antipsychotic Therapies: NA  Discharge Instructions     Diet - low sodium heart healthy   Complete by: As directed    Increase activity slowly   Complete by: As directed    Increase activity slowly   Complete by: As directed       Allergies as of 04/10/2022   No Known Allergies      Medication List     TAKE these medications      Indication  clonazePAM 0.5 MG tablet Commonly known as: KLONOPIN Take 1  tablet (0.5 mg total) by mouth 2 (two) times daily.  Indication: Feeling Anxious   hydrOXYzine 25 MG tablet Commonly known as: ATARAX Take 1 tablet (25 mg total) by mouth 3 (three) times daily as needed for anxiety.  Indication: Feeling Anxious   OLANZapine 10 MG tablet Commonly known as: ZYPREXA Take 1 tablet (10 mg total) by mouth 2 (two) times daily. What changed:  medication strength how much to take  Indication: Schizophrenia   pantoprazole 40 MG tablet Commonly known as: PROTONIX Take 1 tablet (40 mg total) by mouth 2 (two) times daily.  Indication: Stomach Ulcer, Gastroesophageal Reflux Disease         Follow-up recommendations:  Other:  Prescriptions and supply given.  Encourage follow-up with local mental health agency in whichever county he stays in  Comments: See above  Signed: Mordecai Rasmussen, MD 04/10/2022, 10:06 AM

## 2022-04-10 NOTE — [facility] Suicide Risk Assessment (Signed)
[facility] Discharge Suicide Risk Assessment   Principal Problem: Schizophrenia, paranoid (HCC) Discharge Diagnoses: Principal Problem:   Schizophrenia, paranoid (HCC)   Total Time spent with patient: 30 minutes  Musculoskeletal: Strength & Muscle Tone: within normal limits Gait & Station: normal Patient leans: N/A  Psychiatric Specialty Exam  Presentation  General Appearance: Bizarre; Disheveled  Eye Contact:Absent  Speech:Garbled  Speech Volume:Decreased  Handedness:Left   Mood and Affect  Mood:Dysphoric  Duration of Depression Symptoms: No data recorded Affect:Blunt; Inappropriate   Thought Process  Thought Processes:Disorganized; Irrevelant  Descriptions of Associations:Loose  Orientation:-- (UTA)  Thought Content:Illogical; Delusions  History of Schizophrenia/Schizoaffective disorder:Yes  Duration of Psychotic Symptoms:Greater than six months  Hallucinations:No data recorded Ideas of Reference:Delusions; Paranoia  Suicidal Thoughts:No data recorded Homicidal Thoughts:No data recorded  Sensorium  Memory:-- (WILL NOT RESPOND TO QUESTIONS)  Judgment:Impaired  Insight:None   Executive Functions  Concentration:Poor  Attention Span:Poor  Recall:Poor  Fund of Knowledge:Poor  Language:Poor   Psychomotor Activity  Psychomotor Activity:No data recorded  Assets  Assets:Financial Resources/Insurance   Sleep  Sleep:No data recorded  Physical Exam: Physical Exam Vitals and nursing note reviewed.  Constitutional:      Appearance: Normal appearance.  HENT:     Head: Normocephalic and atraumatic.     Mouth/Throat:     Pharynx: Oropharynx is clear.  Eyes:     Pupils: Pupils are equal, round, and reactive to light.  Cardiovascular:     Rate and Rhythm: Normal rate and regular rhythm.  Pulmonary:     Effort: Pulmonary effort is normal.     Breath sounds: Normal breath sounds.  Abdominal:     General: Abdomen is flat.     Palpations:  Abdomen is soft.  Musculoskeletal:        General: Normal range of motion.  Skin:    General: Skin is warm and dry.  Neurological:     General: No focal deficit present.     Mental Status: He is alert. Mental status is at baseline.  Psychiatric:        Attention and Perception: Attention normal.        Mood and Affect: Affect is blunt.        Speech: Speech is delayed.        Behavior: Behavior is withdrawn.        Thought Content: Thought content normal.        Cognition and Memory: Cognition is impaired.    Review of Systems  Constitutional: Negative.   HENT: Negative.    Eyes: Negative.   Respiratory: Negative.    Cardiovascular: Negative.   Gastrointestinal:  Positive for abdominal pain.  Musculoskeletal: Negative.   Skin: Negative.   Neurological: Negative.   Psychiatric/Behavioral: Negative.     Blood pressure 111/74, pulse (!) 101, temperature 97.8 F (36.6 C), temperature source Oral, resp. rate 18, height 5\' 7"  (1.702 m), weight 65.8 kg, SpO2 100 %. Body mass index is 22.72 kg/m.  Mental Status Per Nursing Assessment::   On Admission:  NA  Demographic Factors:  Male, Caucasian, and Low socioeconomic status  Loss Factors: Decline in physical health  Historical Factors: NA  Risk Reduction Factors:   Positive coping skills or problem solving skills  Continued Clinical Symptoms:  Schizophrenia:   Paranoid or undifferentiated type  Cognitive Features That Contribute To Risk:  Closed-mindedness    Suicide Risk:  Minimal: No identifiable suicidal ideation.  Patients presenting with no risk factors but with morbid ruminations; may be classified as  minimal risk based on the severity of the depressive symptoms    Plan Of Care/Follow-up recommendations:  Other:  Supply of medications provided for discharge.  Prescriptions provided.  Referral made to outpatient treatment.  Patient denies any suicidal ideation and has not displayed any dangerous suicidal or  aggressive behavior in the hospital  Mordecai Rasmussen, MD 04/10/2022, 10:04 AM

## 2024-05-03 ENCOUNTER — Other Ambulatory Visit: Payer: Self-pay

## 2024-05-03 ENCOUNTER — Emergency Department (HOSPITAL_COMMUNITY)
Admission: EM | Admit: 2024-05-03 | Discharge: 2024-05-04 | Disposition: A | Discharge status: Home / Self Care | Payer: MEDICAID | Attending: Student in an Organized Health Care Education/Training Program | Admitting: Student in an Organized Health Care Education/Training Program

## 2024-05-03 DIAGNOSIS — F419 Anxiety disorder, unspecified: Secondary | ICD-10-CM | POA: Diagnosis not present

## 2024-05-03 DIAGNOSIS — R410 Disorientation, unspecified: Secondary | ICD-10-CM | POA: Diagnosis not present

## 2024-05-03 DIAGNOSIS — R462 Strange and inexplicable behavior: Secondary | ICD-10-CM | POA: Diagnosis not present

## 2024-05-03 DIAGNOSIS — F200 Paranoid schizophrenia: Secondary | ICD-10-CM | POA: Insufficient documentation

## 2024-05-03 DIAGNOSIS — R4689 Other symptoms and signs involving appearance and behavior: Secondary | ICD-10-CM | POA: Insufficient documentation

## 2024-05-03 DIAGNOSIS — Z79899 Other long term (current) drug therapy: Secondary | ICD-10-CM | POA: Insufficient documentation

## 2024-05-03 LAB — RAPID URINE DRUG SCREEN, HOSP PERFORMED
Amphetamines: NOT DETECTED
Barbiturates: NOT DETECTED
Benzodiazepines: NOT DETECTED
Cocaine: NOT DETECTED
Opiates: NOT DETECTED
Tetrahydrocannabinol: NOT DETECTED

## 2024-05-03 MED ORDER — OLANZAPINE 10 MG PO TABS
5.0000 mg | ORAL_TABLET | Freq: Two times a day (BID) | ORAL | Status: DC
  Administered 2024-05-03 – 2024-05-04 (×2): 5 mg via ORAL
  Filled 2024-05-03 (×2): qty 1

## 2024-05-03 NOTE — ED Provider Notes (Signed)
 [place] EMERGENCY DEPARTMENT AT [facility] Provider Note   CSN: [id number] Arrival date & time: 05/03/24  1548     Patient presents with: Psychiatric Evaluation   Danny Blackwell is a 53 y.o. male.  {Add pertinent medical, surgical, social history, OB history to HPI:5045} 53 year old male with a past medical history of schizophrenia and paranoia brought to the emergency department due to concerns about his behavior in public.  Patient was acting oddly in public and has incoherent speech.  He is denying SI/HI.  He restaurant called police who brought him to the emergency department after observing his odd behavior.  He cannot tell us  if he is taking any medications.  He is disheveled and asking for food         Prior to Admission medications   Medication Sig Start Date End Date Taking? Authorizing Provider  clonazePAM  (KLONOPIN ) 0.5 MG tablet Take 1 tablet (0.5 mg total) by mouth 2 (two) times daily. 04/09/22   Clapacs, Norleen DASEN, MD  hydrOXYzine  (ATARAX ) 25 MG tablet Take 1 tablet (25 mg total) by mouth 3 (three) times daily as needed for anxiety. 04/09/22   Clapacs, Norleen DASEN, MD  OLANZapine  (ZYPREXA ) 10 MG tablet Take 1 tablet (10 mg total) by mouth 2 (two) times daily. 04/09/22   Clapacs, Norleen DASEN, MD  pantoprazole  (PROTONIX ) 40 MG tablet Take 1 tablet (40 mg total) by mouth 2 (two) times daily. 04/09/22   Clapacs, Norleen DASEN, MD    Allergies: Patient has no known allergies.    Review of Systems  All other systems reviewed and are negative.   Updated Vital Signs BP 104/84   Pulse 86   Temp 98 F (36.7 C)   Resp 16   Ht 5' 7 (1.702 m)   Wt 64 kg   SpO2 99%   BMI 22.10 kg/m   Physical Exam Vitals and nursing note reviewed.  Constitutional:      Comments: Disheveled Covered in dirt Hair in knots  Eyes:     Extraocular Movements: Extraocular movements intact.     Conjunctiva/sclera: Conjunctivae normal.  Cardiovascular:     Rate and Rhythm: Normal rate.   Pulmonary:     Effort: Pulmonary effort is normal.  Abdominal:     General: There is no distension.  Musculoskeletal:        General: No signs of injury.  Skin:    General: Skin is dry.  Neurological:     Mental Status: He is disoriented.  Psychiatric:        Mood and Affect: Mood is anxious.        Cognition and Memory: Cognition is impaired.     Comments: Incoherent speech     (all labs ordered are listed, but only abnormal results are displayed) Labs Reviewed  COMPREHENSIVE METABOLIC PANEL WITH GFR  ETHANOL  RAPID URINE DRUG SCREEN, HOSP PERFORMED  CBC WITH DIFFERENTIAL/PLATELET    EKG: None  Radiology: No results found.  {Document cardiac monitor, telemetry assessment procedure when appropriate:32947} Procedures   Medications Ordered in the ED - No data to display    {Click here for ABCD2, HEART and other calculators REFRESH Note before signing:1}                              Medical Decision Making 53 year old male with an extensive psychiatric history brought here due to odd behavior in public.  He appears disheveled and  homeless.  He was recently discharged this month from psychiatric stay. History is limited due to his presentation.  No evidence of trauma on exam. Patient is having disorganized on behavior with incoherent speech, which is concerning for acute psychosis.  We will get basic blood work and consult psych for evaluation.  Psychiatric team recommends reassessing him in the morning.  They are going to start Zyprexa .  Psychiatric team reports that he will not talk to them and hope that overnight observation will help.  Amount and/or Complexity of Data Reviewed Labs: ordered.    Final diagnoses:  None    ED Discharge Orders     None

## 2024-05-03 NOTE — Consult Note (Signed)
 [facility] Psychiatric Consult Initial  Patient Name: .Danny Blackwell  MRN: [id number]  DOB: October 26, 1970  Consult Order details:  Orders (From admission, onward)     Start     Ordered   05/03/24 1713  CONSULT TO CALL ACT TEAM       Ordering Provider: Corinthia No, DO  Provider:  (Not yet assigned)  Question:  Reason for Consult?  Answer:  psychosis   05/03/24 1712             Mode of Visit: In person    Psychiatry Consult Evaluation  Service Date: May 03, 2024 LOS:  LOS: 0 days  Chief Complaint brought in via [employer] for concerns about his behavior in public.  Patient was at a oddly in public and had a incoherent speech.  Per [employer] patient was malingering at a restaurant and police was called.  Primary Psychiatric Diagnoses  Bizarre behavior 2.  Other symptoms and signs involving appearance and behavior   Assessment  Danny Blackwell is a 53 y.o. male admitted: Presented to the EDfor 05/03/2024  3:48 PM for due to concerns about his behavior in public.  Patient was acting oddly in public and has an incoherent speech..  Per chart review patient has a past psychiatric diagnoses of paranoid schizophrenia and has a no significant medical history on file. . -Due to patient's refusal to engage in assessment, risk of harm to self or others cannot be reliably ruled out.  Given history of schizophrenia, impaired self-care, bizarre behavior in public and current presentation,--a full evaluation cannot be completed at this time.  Recommending overnight observation with initiation of psychiatric medications for stabilization.   Patient has a documented history of paranoid schizophrenia was brought to the ED by [employer] due to odd and bizarre behaviors in public.  On arrival he is disheveled, malodorous, with poor hygiene (uncut dirty fingernails, long hair).  He appears thin and poorly nourished.  He is guarded and uncooperative.  His affect is blunted.  During assessment the patient is  nonparticipatory, he refuses to answer questions and not engaging in interview.  Due to his refusal to engage, it is impossible to rule out if patient is a danger to self or others or if he is experiencing any psychosis.  This is due to patient being noncommunicative and not participating in the assessment.  He observed scanning the room with a guarded, suspicious demeanor suggestive of paranoia.  Per nursing note patient was minimally contributory and did deny SI/HI.  He also told staff that he had been walking for a while and needed something to eat.   Diagnoses:  Active Hospital problems: Principal Problem:   Bizarre behavior Active Problems:   Other symptoms and signs involving appearance and behavior    Plan   ## Psychiatric Medication Recommendations:  Start Zyprexa  5 mg twice daily  ## Medical Decision Making Capacity: Not specifically addressed in this encounter  ## Further Work-up:  -Recommend EKG -- Pertinent labwork that has been ordered but not collected his CBC, UDS, ethanol, CMP.   ## Disposition:--Given the patient's limited participation in assessment and current presentation, a full evaluation cannot be completed at this time.  Recommending overnight observation with initiation of psychiatric medications for stabilization.   ## Behavioral / Environmental: -Utilize compassion and acknowledge the patient's experiences while setting clear and realistic expectations for care.    ## Safety and Observation Level:  - Based on my clinical evaluation, I estimate the patient to be  at unable to identify if patient is risk of self harm in the current setting. - At this time, we recommend  1:1 Observation. This decision is based on my review of the chart including patient's history and current presentation, interview of the patient, mental status examination, and consideration of suicide risk including evaluating suicidal ideation, plan, intent, suicidal or self-harm behaviors,  risk factors, and protective factors. This judgment is based on our ability to directly address suicide risk, implement suicide prevention strategies, and develop a safety plan while the patient is in the clinical setting. Please contact our team if there is a concern that risk level has changed.  CSSR Risk Category:C-SSRS RISK CATEGORY: No Risk  Suicide Risk Assessment: Patient has following modifiable risk factors for suicide: Unable to address, which we are addressing by recommending inpatient psychiatric admission. Patient has following non-modifiable or demographic risk factors for suicide: male gender and psychiatric hospitalization Patient has the following protective factors against suicide: Unable to assess  Thank you for this consult request. Recommendations have been communicated to the primary team.  We will continue to follow while awaiting psychiatric bed placement at this time.   Elveria VEAR Batter, NP       History of Present Illness  Relevant Aspects of Hospital ED Course:  Admitted on 05/03/2024 or due to concerns about his behavior in public.  Patient was acting oddly in public and has an incoherent speech..  Per chart review patient has a past psychiatric diagnoses of paranoid schizophrenia and has a no significant medical history on file. .   Patient Report:  Patient will not speak  Psych ROS:  Depression: Patient will not answer Anxiety:  Patient will not answer Mania (lifetime and current): Patient will not answer Psychosis: (lifetime and current): Patient will not answer per chart review patient has history of psychosis  Collateral information:  Per chart number for  mother Danny Blackwell [phone] no answer   Review of Systems  Unable to perform ROS: Acuity of condition     Psychiatric and Social History  Psychiatric History:  Information collected from chart review-patient noncontributory  Prev Dx/Sx: Per chart paranoid schizophrenia, trauma, anxiety,  depression Current Psych Provider: Patient will not answer Home Meds (current): Patient will not answer Previous Med Trials: Patient will not answer-per chart Zyprexa  Therapy: Patient will not answer  Prior Psych Hospitalization: Per chart patient has been placed under involuntary commitment in the past 08/2020 for AVH.  It was noted at that time that patient had not made any year.  He has been seen in the ED for psychosis. Prior Self Harm: Patient will not answer Prior Violence: Patient will not answer  Family Psych History: Patient will not answer Family Hx suicide: Patient will not answer  Social History:  Developmental Hx: Patient will not answer Educational Hx: Patient will not answer Occupational Hx: Patient will not answer Legal Hx: Patient will not answer Living Situation: Patient will not answer Spiritual Hx: Patient will not answer Access to weapons/lethal means: Patient will not answer   Substance History Alcohol: Patient will not answer  Type of alcohol Patient will not answer Last Drink Patient will not answer Number of drinks per day Patient will not answer History of alcohol withdrawal seizures Patient will not answer History of DT's Patient will not answer Tobacco: Patient will not answer Illicit drugs: Patient will not answer Prescription drug abuse: Patient will not answer Rehab hx: Patient will not answer  Exam Findings  Physical Exam:  Vital Signs:  Temp:  [98 F (36.7 C)] 98 F (36.7 C) (09/30 1553) Pulse Rate:  [86] 86 (09/30 1553) Resp:  [16] 16 (09/30 1553) BP: (104)/(84) 104/84 (09/30 1553) SpO2:  [99 %] 99 % (09/30 1553) Weight:  [64 kg] 64 kg (09/30 1554) Blood pressure 104/84, pulse 86, temperature 98 F (36.7 C), resp. rate 16, height 5' 7 (1.702 m), weight 64 kg, SpO2 99%. Body mass index is 22.1 kg/m.  Physical Exam Pulmonary:     Effort: No respiratory distress.  Neurological:     Mental Status: He is alert.     Comments: Will not  answer orientation questions  Psychiatric:        Attention and Perception: He is inattentive.        Mood and Affect: Affect is blunt and flat.        Speech: He is noncommunicative.        Behavior: Behavior is withdrawn.        Thought Content: Thought content is paranoid.     Mental Status Exam: General Appearance: Disheveled malodorous  Orientation:  Patient will not answer  Memory: Patient will not answer  Concentration:  Concentration: Poor and Attention Span: Poor  Recall:  Patient will not answer  Attention  Poor  Eye Contact:  None  Speech:  Will not answer  Language:  Not able to be assessed  Volume: Not able to be assessed  Mood: Not able to be assessed  Affect:  Blunt and Flat  Thought Process: Unable to assess  Thought Content:  Paranoid Ideation unable to assess fully but patient does appear paranoid as he looks around the area consistently  Suicidal Thoughts: Unable to be assessed  Homicidal Thoughts: Unable to be assessed  Judgement: Unable to be assessed  Insight: Unable to be assessed  Psychomotor Activity:  Normal  Akathisia: Not assessed  Fund of Knowledge: Unable to be assessed      Assets: Unable to be assessed  Cognition: Unable to be assessed  ADL's: Unable to be assessed  AIMS (if indicated):        Other History   These have been pulled in through the EMR, reviewed, and updated if appropriate.  Family History:  The patient's family history is not on file.  Medical History: No past medical history on file.  Surgical History: No past surgical history on file.   Medications:  No current facility-administered medications for this encounter.  Current Outpatient Medications:    clonazePAM  (KLONOPIN ) 0.5 MG tablet, Take 1 tablet (0.5 mg total) by mouth 2 (two) times daily., Disp: 60 tablet, Rfl: 1   hydrOXYzine  (ATARAX ) 25 MG tablet, Take 1 tablet (25 mg total) by mouth 3 (three) times daily as needed for anxiety., Disp: 90 tablet, Rfl: 1    OLANZapine  (ZYPREXA ) 10 MG tablet, Take 1 tablet (10 mg total) by mouth 2 (two) times daily., Disp: 60 tablet, Rfl: 1   pantoprazole  (PROTONIX ) 40 MG tablet, Take 1 tablet (40 mg total) by mouth 2 (two) times daily., Disp: 60 tablet, Rfl: 1  Allergies: No Known Allergies  Elveria VEAR Batter, NP

## 2024-05-03 NOTE — ED Notes (Signed)
 Pt given something to eat and drink.

## 2024-05-03 NOTE — ED Triage Notes (Signed)
 EMS was called to a restaurant because he was malingering per [employer] and they wanted him sent here for an eval.    Pt reports he's been walking for awhile and just needed something to eat. Pt denies si/hi. Seems a little anxious. Pt kind in triage. Does appear to ramble at times. Unable to tell me if he takes meds or is supposed too.

## 2024-05-03 NOTE — ED Notes (Signed)
 Tech and phlebotomist attempted to get labs on pt. Upon asking pt. If it was ok for us  to stick him for a needle, pt. Did not respond to question. Pt. Stared blankly at the computer screen. Tech repeated, Is it ok if we get some blood from you? We just need a yes or no answer. If you don't say anything, we will assume it is a no. Pt. Still did not answer question. No labs obtained

## 2024-05-04 ENCOUNTER — Encounter (HOSPITAL_COMMUNITY): Payer: Self-pay

## 2024-05-04 LAB — COMPREHENSIVE METABOLIC PANEL WITH GFR
ALT: 11 U/L (ref 0–44)
AST: 17 U/L (ref 15–41)
Albumin: 3.3 g/dL — ABNORMAL LOW (ref 3.5–5.0)
Alkaline Phosphatase: 34 U/L — ABNORMAL LOW (ref 38–126)
Anion gap: 8 (ref 5–15)
BUN: 13 mg/dL (ref 6–20)
CO2: 25 mmol/L (ref 22–32)
Calcium: 8.5 mg/dL — ABNORMAL LOW (ref 8.9–10.3)
Chloride: 106 mmol/L (ref 98–111)
Creatinine, Ser: 0.67 mg/dL (ref 0.61–1.24)
GFR, Estimated: >60 mL/min (ref >60)
Glucose, Bld: 113 mg/dL — ABNORMAL HIGH (ref 70–99)
Potassium: 3.9 mmol/L (ref 3.5–5.1)
Sodium: 139 mmol/L (ref 135–145)
Total Bilirubin: 0.4 mg/dL (ref 0.0–1.2)
Total Protein: 6.1 g/dL — ABNORMAL LOW (ref 6.5–8.1)

## 2024-05-04 LAB — CBC WITH DIFFERENTIAL/PLATELET
Abs Immature Granulocytes: 0.06 K/uL (ref 0.00–0.07)
Basophils Absolute: 0.1 K/uL (ref 0.0–0.1)
Basophils Relative: 1 %
Eosinophils Absolute: 0.4 K/uL (ref 0.0–0.5)
Eosinophils Relative: 5 %
HCT: 41.1 % (ref 39.0–52.0)
Hemoglobin: 13.2 g/dL (ref 13.0–17.0)
Immature Granulocytes: 1 %
Lymphocytes Relative: 33 %
Lymphs Abs: 2.6 K/uL (ref 0.7–4.0)
MCH: 29.3 pg (ref 26.0–34.0)
MCHC: 32.1 g/dL (ref 30.0–36.0)
MCV: 91.1 fL (ref 80.0–100.0)
Monocytes Absolute: 0.7 K/uL (ref 0.1–1.0)
Monocytes Relative: 9 %
Neutro Abs: 4.2 K/uL (ref 1.7–7.7)
Neutrophils Relative %: 51 %
Platelets: 353 K/uL (ref 150–400)
RBC: 4.51 MIL/uL (ref 4.22–5.81)
RDW: 14.5 % (ref 11.5–15.5)
WBC: 8 K/uL (ref 4.0–10.5)
nRBC: 0 % (ref 0.0–0.2)

## 2024-05-04 MED ORDER — OLANZAPINE 10 MG PO TABS: 5.0000 mg | ORAL_TABLET | Freq: Two times a day (BID) | ORAL | 0 refills | Status: AC

## 2024-05-04 NOTE — Consult Note (Addendum)
 [facility] Psychiatric Consult Initial  Patient Name: .Danny Blackwell  MRN: [id number]  DOB: 03-30-1971  Consult Order details:  Orders (From admission, onward)     Start     Ordered   05/03/24 1713  CONSULT TO CALL ACT TEAM       Ordering Provider: Corinthia No, DO  Provider:  (Not yet assigned)  Question:  Reason for Consult?  Answer:  psychosis   05/03/24 1712             Mode of Visit: In person    Psychiatry Consult Evaluation  Service Date: May 04, 2024 LOS:  LOS: 0 days  Chief Complaint brought in via [employer] for concerns about his behavior in public.  Patient was at a oddly in public and had a incoherent speech.  Per [employer] patient was malingering at a restaurant and police was called.  Primary Psychiatric Diagnoses  Bizarre behavior 2.  Other symptoms and signs involving appearance and behavior   Assessment  Danny Blackwell is a 53 y.o. male admitted: Presented to the EDfor 05/03/2024  3:48 PM for due to concerns about his behavior in public.  Patient was acting oddly in public and has an incoherent speech..  Per chart review patient has a past psychiatric diagnoses of paranoid schizophrenia and has a no significant medical history on file. . -Due to patient's refusal to engage in assessment, risk of harm to self or others cannot be reliably ruled out.  Given history of schizophrenia, impaired self-care, bizarre behavior in public and current presentation,--a full evaluation cannot be completed at this time.  Recommending overnight observation with initiation of psychiatric medications for stabilization.   Patient has a documented history of paranoid schizophrenia was brought to the ED by [employer] due to odd and bizarre behaviors in public.  On arrival he is disheveled, malodorous, with poor hygiene (uncut dirty fingernails, long hair).  He appears thin and poorly nourished.  He is guarded and uncooperative.  His affect is blunted.  During assessment the patient is  nonparticipatory, he refuses to answer questions and not engaging in interview.  Due to his refusal to engage, it is impossible to rule out if patient is a danger to self or others or if he is experiencing any psychosis.  This is due to patient being noncommunicative and not participating in the assessment.  He observed scanning the room with a guarded, suspicious demeanor suggestive of paranoia.  Per nursing note patient was minimally contributory and did deny SI/HI.  He also told staff that he had been walking for a while and needed something to eat.  05/04/2024  Upon evaluation, patient presents with symptomology that is most consistent with the patient's chronic illness course of paranoid schizophrenia. Evidence of this is appreciable from examination conducted by this provider, where the patient presents severely disheveled and malodorous in scrubs, with an oddly related, atypical, and paranoid interpersonal style, and a severely concrete thought process.   From investigation conducted by this provider, of which has included extensive chart review, and evaluation conducted by this provider with the patient, there is no evidence that the patient is an imminent risk to himself or others, warranting the recommendation for involuntary commitment and forced treatment. There is evidence that the patient is actively psychotic, and would likely benefit from further stabilization on antipsychotic medications, such as olanzapine  that has helped to promote some degree of stability for the patient historically (see [facility] admission 2023), but the patient endorses adamantly that  he has no desire to participate in inpatient mental health hospitalization, and that his only goal is to go to a local shelter.   Given the above, recommendation is for psychiatric clearance, as well as the additional recommendations listed below.  Patient endorsed that he was not amenable to continuing olanzapine  upon discharge, or following  up closely with outpatient psychiatric services, but endorsed that he was amenable to being given shelter resources.  Spoke with Dr. Goli who is in agreement with recommendation for psychiatric clearance, as well as additional recommendations listed below.  Diagnoses:  Active Hospital problems: Principal Problem:   Bizarre behavior Active Problems:   Other symptoms and signs involving appearance and behavior    Plan   # Paranoid schizophrenia  ## Psychiatric Recommendations:   -Recommend patient consider continuing Zyprexa  5 mg twice daily -Recommend patient consider close outpatient follow-up with psychiatric medication management services at the [facility] - Recommend patient be given shelter resources upon discharge - Recommend safety return precautions  ## Medical Decision Making Capacity: Not specifically addressed in this encounter  ## Further Work-up: None at this time  ## Disposition:--There are no psychiatric contraindications to discharge at this time  ## Behavioral / Environmental: -Routine safety/agitation precautions until discharge; safety return precautions upon discharge    ## Safety and Observation Level:  - Based on my clinical evaluation, I estimate the patient to be at low risk self harm in the current setting and upon recommendation for discharge. - At this time, we recommend  1:1 Observation. This decision is based on my review of the chart including patient's history and current presentation, interview of the patient, mental status examination, and consideration of suicide risk including evaluating suicidal ideation, plan, intent, suicidal or self-harm behaviors, risk factors, and protective factors. This judgment is based on our ability to directly address suicide risk, implement suicide prevention strategies, and develop a safety plan while the patient is in the clinical setting. Please contact our team if there is a concern that risk level has changed.  CSSR  Risk Category:C-SSRS RISK CATEGORY: No Risk  Suicide Risk Assessment: Patient has following modifiable risk factors for suicide: Unable to address, which we are addressing by recommending inpatient psychiatric admission. Patient has following non-modifiable or demographic risk factors for suicide: male gender and psychiatric hospitalization Patient has the following protective factors against suicide: Unable to assess  Thank you for this consult request. Recommendations have been communicated to the primary team.  We we will sign off at this time.   Danny JINNY Gravely, Danny Blackwell       History of Present Illness  Relevant Aspects of Hospital ED Course:  Admitted on 05/03/2024 or due to concerns about his behavior in public.  Patient was acting oddly in public and has an incoherent speech..  Per chart review patient has a past psychiatric diagnoses of paranoid schizophrenia and has a no significant medical history on file. .   Patient Report:  Patient will not speak  Psych ROS:  Depression: Patient will not answer Anxiety:  Patient will not answer Mania (lifetime and current): Patient will not answer Psychosis: (lifetime and current): Patient will not answer per chart review patient has history of psychosis  Collateral information:  Per chart number for  mother Danny Blackwell [phone] no answer   05/04/2024  Patient seen today at the [facility] for face-to-face psychiatric evaluation.  Upon evaluation, patient presents with an atypical, paranoid, and oddly related interpersonal style, with an oddly constricted  affect, no eye contact, and very disheveled and malodorous presentation, with a very concrete, defensive, and reserved thought process.   Immediately upon approach, patient endorses that, Am I free to go?.  Prompted to expand on this, patient states, I was brought in because I needed food, that was it..Am I free to go?.   Patient directed to discuss the  recent events that transpired which led to being brought in, to which the patient declines to answer and states, I don't need to answer that question, I just did.  Patient directed and encouraged to answer a variety of other questions, but repeatedly declines to answer.  Patient directed to inquire if he was experiencing any suicidal or homicidal ideations, to which patient endorses that, that would be impossible, I would never hurt myself or anyone else as a human.  Patient directed to answer if he has ever hurt himself and/or anyone else, at which point patient gives the aforementioned answer again.  Patient prompted to answer orientation questions, but declines, states, I do not need to answer that question, and appreciably presents with grossly intact orientation, without concerns for fluctuations of consciousness.  Patient does endorse however not experiencing any auditory or visual hallucinations when asked, but objectively, does appear to be paranoid, as he frequently looks around his environment.  Patient does additionally endorse no EtOH and/or drug use, stating, as a human that is not possible; UDS and BAL unremarkable.  Discussed with patient the recommendation to consider inpatient mental health hospitalization, to which patient endorses that he is not amenable to this, states that he only has a desire to go to a local shelter and to obtain food, and then states again, Am I free to go?Danny Blackwell  Discussed with patient that given he is not an imminent risk to himself or others, recommendation would be for the above, and ultimately psychiatric clearance.  Review of Systems  Unable to perform ROS: Psychiatric disorder (Limited)  Psychiatric/Behavioral:  Negative for hallucinations, substance abuse and suicidal ideas.      Psychiatric and Social History  Psychiatric History:  Information collected from chart review-patient noncontributory  Prev Dx/Sx: Per chart paranoid  schizophrenia, trauma, anxiety, depression Current Psych Provider: Patient will not answer Home Meds (current): Patient will not answer Previous Med Trials: Patient will not answer-per chart Zyprexa  Therapy: Patient will not answer  Prior Psych Hospitalization: Per chart patient has been placed under involuntary commitment in the past 08/2020 for AVH.  It was noted at that time that patient had not made any year.  He has been seen in the ED for psychosis. Prior Self Harm: Patient will not answer Prior Violence: Patient will not answer  Family Psych History: Patient will not answer Family Hx suicide: Patient will not answer  Social History:  Developmental Hx: Patient will not answer Educational Hx: Patient will not answer Occupational Hx: Patient will not answer Legal Hx: Patient will not answer Living Situation: Patient will not answer Spiritual Hx: Patient will not answer Access to weapons/lethal means: Patient will not answer   Substance History Alcohol: Patient will not answer  Type of alcohol Patient will not answer Last Drink Patient will not answer Number of drinks per day Patient will not answer History of alcohol withdrawal seizures Patient will not answer History of DT's Patient will not answer Tobacco: Patient will not answer Illicit drugs: Patient will not answer Prescription drug abuse: Patient will not answer Rehab hx: Patient will not answer  Exam Findings  Physical Exam:  as below Vital Signs:  Temp:  [97.6 F (36.4 C)-98 F (36.7 C)] 97.9 F (36.6 C) (10/01 0941) Pulse Rate:  [60-86] 60 (10/01 0941) Resp:  [16-20] 17 (10/01 0941) BP: (101-126)/(71-106) 101/71 (10/01 0941) SpO2:  [95 %-100 %] 100 % (10/01 0941) Weight:  [64 kg] 64 kg (09/30 1554) Blood pressure 101/71, pulse 60, temperature 97.9 F (36.6 C), temperature source Oral, resp. rate 17, height 5' 7 (1.702 m), weight 64 kg, SpO2 100%. Body mass index is 22.1 kg/m.  Physical Exam Vitals and  nursing note reviewed.  Constitutional:      General: He is not in acute distress.    Appearance: He is not ill-appearing, toxic-appearing or diaphoretic.     Comments: Atypical, paranoid, and oddly related interpersonal style   Pulmonary:     Effort: Pulmonary effort is normal. No respiratory distress.  Skin:    General: Skin is warm and dry.  Neurological:     Motor: No weakness, tremor or seizure activity.     Comments: Grossly intact   Psychiatric:        Attention and Perception: He is inattentive. He does not perceive auditory or visual hallucinations.        Mood and Affect: Affect is inappropriate (Oddly constricted).        Speech: He is communicative.        Behavior: Behavior is withdrawn.        Thought Content: Thought content is not paranoid. Thought content does not include homicidal or suicidal ideation.     Comments: Speech: Minimal Cognition and memory: Grossly intact Judgment: Grossly intact     Mental Status Exam: General Appearance: Disheveled malodorous; atypical, paranoid, and oddly related interpersonal style, in scrubs, with normal bulk and tone  Orientation: Grossly intact, no concerns for fluctuations in consciousness  Memory: Unable to assess  Concentration:  Concentration: Poor and Attention Span: Poor  Recall: Unable to assess  Attention  Poor  Eye Contact:  None  Speech: Reserved and minimal  Language: Fair  Volume: Soft  Mood: Nothing is wrong with me  Affect: Oddly constricted  Thought Process: Concrete with paranoid edge  Thought Content: Reserved with paranoid edge  Suicidal Thoughts: Denies  Homicidal Thoughts: Denies  Judgement: Chronically poor  Insight: None  Psychomotor Activity:  Normal  Akathisia: None  Fund of Knowledge: Grossly intact      Assets: Unable to assess  Cognition: Chronically poor  ADL's: Grossly intact  AIMS (if indicated):   0     Other History   These have been pulled in through the EMR, reviewed,  and updated if appropriate.  Family History:  The patient's family history is not on file.  Medical History: History reviewed. No pertinent past medical history.  Surgical History: History reviewed. No pertinent surgical history.   Medications:   Current Facility-Administered Medications:    OLANZapine  (ZYPREXA ) tablet 5 mg, 5 mg, Oral, BID, Coleman, Carolyn H, Danny Blackwell, 5 mg at 05/04/24 9078  Current Outpatient Medications:    clonazePAM  (KLONOPIN ) 0.5 MG tablet, Take 1 tablet (0.5 mg total) by mouth 2 (two) times daily., Disp: 60 tablet, Rfl: 1   hydrOXYzine  (ATARAX ) 25 MG tablet, Take 1 tablet (25 mg total) by mouth 3 (three) times daily as needed for anxiety., Disp: 90 tablet, Rfl: 1   OLANZapine  (ZYPREXA ) 10 MG tablet, Take 1 tablet (10 mg total) by mouth 2 (two) times daily., Disp: 60 tablet, Rfl: 1   pantoprazole  (PROTONIX ) 40 MG tablet,  Take 1 tablet (40 mg total) by mouth 2 (two) times daily., Disp: 60 tablet, Rfl: 1  Allergies: No Known Allergies  Danny JINNY Gravely, Danny Blackwell

## 2024-05-04 NOTE — Discharge Instructions (Addendum)
 [facility]  Thank you for allowing us  to take care of you today.  You came to the Emergency Department today because you were acting abnormally yesterday.  Here in the emergency department after getting Zyprexa  you are now more calm.  You are not having any thoughts of hurting yourself or anyone else.  Psychiatry evaluated you, you are not demonstrating any behaviors that would require us  to hospitalize you such as thoughts of hurting yourself or anyone else, and you do not want to stay for treatment voluntarily.  We do recommend that you follow-up at the behavioral health urgent care for continued medication management.  We do recommend that you take your Zyprexa .  We are sending a refill to your preferred pharmacy.  We recommend that you follow-up at the behavioral health urgent care: Address: [address], [place], [place] [address], Phone: [phone].  They can continue your management outpatient  To-Do: 1. Please follow-up with your primary doctor within 2 days / as soon as possible.   Please return to the Emergency Department or call 911 if you experience have worsening of your symptoms, or do not get better, you have thoughts of hurting yourself or anyone else, chest pain, shortness of breath, severe or significantly worsening pain, high fever, severe confusion, pass out or have any reason to think that you need emergency medical care.   We hope you feel better soon.   Department of Emergency Medicine [facility]

## 2024-05-04 NOTE — ED Notes (Signed)
Shift report received, assumed care of patient at this time 

## 2024-05-04 NOTE — ED Provider Notes (Signed)
 Emergency Medicine Observation Re-evaluation Note  Danny Blackwell is a 53 y.o. male, seen on rounds today.  Pt initially presented to the ED for complaints of Psychiatric Evaluation Patient has a history of schizophrenia and paranoia, was brought in for bizarre behavior.  Patient is not under IVC.  Psychiatry was consulted, recommends starting Zyprexa  and reevaluating in the a.m. of 10/1. Currently, the patient is sleeping comfortably.  Physical Exam  BP (!) 126/106 (BP Location: Right Arm)   Pulse 82   Temp 97.6 F (36.4 C) (Oral)   Resp 16   Ht 5' 7 (1.702 m)   Wt 64 kg   SpO2 95%   BMI 22.10 kg/m  Physical Exam General: NAD Lungs: Normal effort Psych: Currently calm  ED Course / MDM  EKG:   I have reviewed the labs performed to date as well as medications administered while in observation.  Recent changes in the last 24 hours include no new labs or interventions since initial evaluation 9/30.  # Paranoid schizophrenia   ## Psychiatric Recommendations:   -Recommend patient consider continuing Zyprexa  5 mg twice daily  -Recommend patient consider close outpatient follow-up with psychiatric medication management services at the [facility]  -Recommend patient be given shelter resources upon discharge  -Recommend safety return precautions  ## Medical Decision Making Capacity: Not specifically addressed in this encounter   ## Further Work-up: None at this time   ## Disposition:--There are no psychiatric contraindications to discharge at this time   ## Behavioral / Environmental: -Routine safety/agitation precautions until discharge; safety return precautions upon discharge   Plan  Current plan is for follow-up psychiatry reevaluation in the morning of 10/1.  Psychiatry does not feel that patient has acute decompensation that warrants involuntary admission.  Patient declines voluntary admission on their exam.  I reevaluated the patient, patient continues to be calm, cooperative.   Patient denies SI, HI, states that he would like to be discharged.  Given patient is calm and cooperative and has no SI or HI, do not feel that patient warrants IVC at this point.  Patient declines his medication, discussed that I do recommend that he takes his Zyprexa , will send refill to preferred pharmacy in chart.  Will discharge patient.    Rogelia Jerilynn RAMAN, MD 05/04/24 [phone]
